# Patient Record
Sex: Male | Born: 1980 | Race: White | Hispanic: No | Marital: Married | Smoking: Never smoker
Health system: Southern US, Community
[De-identification: ages and names within clinical notes are randomized; demographics above are authoritative.]

## PROBLEM LIST (undated history)

## (undated) DIAGNOSIS — K5090 Crohn's disease, unspecified, without complications: Secondary | ICD-10-CM

## (undated) HISTORY — PX: SMALL INTESTINE SURGERY: SHX150

## (undated) HISTORY — PX: TONSILLECTOMY: SUR1361

---

## 2011-08-13 ENCOUNTER — Encounter: Payer: Self-pay | Admitting: *Deleted

## 2011-08-13 DIAGNOSIS — D72829 Elevated white blood cell count, unspecified: Secondary | ICD-10-CM | POA: Diagnosis present

## 2011-08-13 DIAGNOSIS — K5090 Crohn's disease, unspecified, without complications: Principal | ICD-10-CM | POA: Diagnosis present

## 2011-08-13 DIAGNOSIS — Z79899 Other long term (current) drug therapy: Secondary | ICD-10-CM | POA: Insufficient documentation

## 2011-08-13 DIAGNOSIS — Z9049 Acquired absence of other specified parts of digestive tract: Secondary | ICD-10-CM

## 2011-08-13 DIAGNOSIS — K219 Gastro-esophageal reflux disease without esophagitis: Secondary | ICD-10-CM | POA: Insufficient documentation

## 2011-08-13 DIAGNOSIS — T380X5A Adverse effect of glucocorticoids and synthetic analogues, initial encounter: Secondary | ICD-10-CM | POA: Diagnosis present

## 2011-08-13 DIAGNOSIS — F19982 Other psychoactive substance use, unspecified with psychoactive substance-induced sleep disorder: Secondary | ICD-10-CM | POA: Diagnosis present

## 2011-08-13 DIAGNOSIS — D62 Acute posthemorrhagic anemia: Secondary | ICD-10-CM | POA: Diagnosis present

## 2011-08-13 DIAGNOSIS — K625 Hemorrhage of anus and rectum: Secondary | ICD-10-CM | POA: Insufficient documentation

## 2011-08-13 DIAGNOSIS — R000 Tachycardia, unspecified: Secondary | ICD-10-CM | POA: Diagnosis present

## 2011-08-13 DIAGNOSIS — R197 Diarrhea, unspecified: Secondary | ICD-10-CM | POA: Insufficient documentation

## 2011-08-13 DIAGNOSIS — IMO0002 Reserved for concepts with insufficient information to code with codable children: Secondary | ICD-10-CM

## 2011-08-13 NOTE — ED Notes (Signed)
Pt c/o passing dark brown blood from his rectum x 2hrs.

## 2011-08-14 ENCOUNTER — Inpatient Hospital Stay (HOSPITAL_COMMUNITY): Payer: BC Managed Care – PPO

## 2011-08-14 ENCOUNTER — Inpatient Hospital Stay (HOSPITAL_COMMUNITY)
Admission: AD | Admit: 2011-08-14 | Discharge: 2011-08-16 | DRG: 179 | Disposition: A | Discharge status: Home / Self Care | Payer: BC Managed Care – PPO | Source: Other Acute Inpatient Hospital | Attending: Internal Medicine | Admitting: Internal Medicine

## 2011-08-14 ENCOUNTER — Emergency Department (HOSPITAL_COMMUNITY)
Admission: EM | Admit: 2011-08-14 | Discharge: 2011-08-14 | Disposition: A | Discharge status: Other Acute Inpatient Hospital | Payer: BC Managed Care – PPO | Source: Home / Self Care | Attending: Emergency Medicine | Admitting: Emergency Medicine

## 2011-08-14 DIAGNOSIS — K5090 Crohn's disease, unspecified, without complications: Secondary | ICD-10-CM

## 2011-08-14 DIAGNOSIS — K625 Hemorrhage of anus and rectum: Secondary | ICD-10-CM

## 2011-08-14 HISTORY — DX: Crohn's disease, unspecified, without complications: K50.90

## 2011-08-14 LAB — CBC
MCH: 28.4 pg (ref 26.0–34.0)
MCHC: 32.7 g/dL (ref 30.0–36.0)
MCV: 86.8 fL (ref 78.0–100.0)
Platelets: 335 10*3/uL (ref 150–400)

## 2011-08-14 LAB — BASIC METABOLIC PANEL
CO2: 27 mEq/L (ref 19–32)
Calcium: 8.5 mg/dL (ref 8.4–10.5)
Creatinine, Ser: 1.05 mg/dL (ref 0.50–1.35)
GFR calc non Af Amer: >60 mL/min (ref >60)
Glucose, Bld: 122 mg/dL — ABNORMAL HIGH (ref 70–99)
Sodium: 136 mEq/L (ref 135–145)

## 2011-08-14 LAB — HEMOGLOBIN AND HEMATOCRIT, BLOOD: HCT: 37.2 % — ABNORMAL LOW (ref 39.0–52.0)

## 2011-08-14 MED ORDER — SODIUM CHLORIDE 0.9 % IV SOLN
Freq: Once | INTRAVENOUS | Status: AC
  Administered 2011-08-14: 08:00:00 via INTRAVENOUS

## 2011-08-14 MED ORDER — SODIUM CHLORIDE 0.9 % IV BOLUS (SEPSIS)
1000.0000 mL | Freq: Once | INTRAVENOUS | Status: AC
  Administered 2011-08-14: 1000 mL via INTRAVENOUS

## 2011-08-14 NOTE — ED Notes (Signed)
BP 97/60 HR of 97. Additional liter bolus ordered. H&H ordered. Verbal order for 2nd line obtained. NAD. PT a/o. Wife at bedside.

## 2011-08-14 NOTE — ED Notes (Addendum)
States 2 loose BM's since 2000, dark maroon in color per patient, denies blood clots, denies any dizziness or weakness, hx of Crohn's dz and is on Prednisone since Sept. 11

## 2011-08-14 NOTE — ED Notes (Signed)
Patient had gotten up to BR and had large loose stool, maroon in color, no formed stool noted, pt had gotten very pale and diaphoreic while sitting on toilet, pt assisted back to bed with wife, pt remains pale, EDP notified of above, vitals stable

## 2011-08-14 NOTE — ED Notes (Signed)
Pt stated he did not wish to have O2 applied at this time. Explained to pt that if Hgb came back low, it would need to be applied.

## 2011-08-14 NOTE — ED Notes (Signed)
Report given to [facility]. Pt a/o VSS.

## 2011-08-14 NOTE — ED Notes (Signed)
MD at bedside. 

## 2011-08-14 NOTE — ED Provider Notes (Addendum)
History     CSN: [id number] Arrival date & time: 08/14/2011 12:27 AM  Chief Complaint  Patient presents with  . Rectal Bleeding    (Consider location/radiation/quality/duration/timing/severity/associated sxs/prior treatment) HPI Comments: Examined at 0101. Patient states that since being on high dose prednisone he has experienced muscle pain, weakness, sweating, increased appetite, poor sleep pattern, GERD symptoms. He is currently taking an unknown GI agent given to him by his father for GERD which has helped reflux symptoms.  Patient is a 30 y.o. male presenting with hematochezia. The history is provided by the patient and the spouse (Patient with long standing h/o Crohn's disease who has recently had a flair. Currently on  prednisone taper. Rectal bleeding begain tonight.).  Rectal Bleeding  The current episode started today. The problem occurs frequently. The problem has been unchanged. The patient is experiencing no pain. The stool is described as mixed with blood. Treatments tried: Patient has had Crohn's under good control on Cimzia until early September when developed pain and loose stools. Placed on high dose prednisone.  Associated symptoms include diarrhea. Pertinent negatives include no anorexia, no fever, no nausea, no rectal pain, no vomiting, no hematuria, no chest pain, no headaches, no coughing and no difficulty breathing. He has been eating and drinking normally. Urine output has been normal. The last void occurred less than 6 hours ago. His past medical history is significant for abdominal surgery and inflammatory bowel disease. Past medical history comments: resection 2002. Recently, medical care has been given by a specialist (Seen last week by GI doctor in [place], Dr. Neoma Laming). Services Performed: change in medications.    Past Medical History  Diagnosis Date  . Crohn disease     Past Surgical History  Procedure Date  . Small intestine surgery   . Tonsillectomy       History reviewed. No pertinent family history.  History  Substance Use Topics  . Smoking status: Never Smoker   . Smokeless tobacco: Not on file  . Alcohol Use: Yes     occasional      Review of Systems  Constitutional: Negative for fever.  Respiratory: Negative for cough.   Cardiovascular: Negative for chest pain.  Gastrointestinal: Positive for diarrhea and hematochezia. Negative for nausea, vomiting, rectal pain and anorexia.  Genitourinary: Negative for hematuria.  Neurological: Negative for headaches.  All other systems reviewed and are negative.    Allergies  Ceclor  Home Medications   Current Outpatient Rx  Name Route Sig Dispense Refill  . CERTOLIZUMAB PEGOL 2 X 200 MG [place] KIT Subcutaneous Inject into the skin.      Marland Kitchen LORAZEPAM 0.5 MG PO TABS Oral Take 0.5 mg by mouth every 8 (eight) hours.      Marland Kitchen PREDNISONE 20 MG PO TABS Oral Take 20 mg by mouth daily.        BP 117/63  Pulse 146  Temp(Src) 98.2 F (36.8 C) (Oral)  Resp 20  Ht 6\' 2"  (1.88 m)  Wt 182 lb (82.555 kg)  BMI 23.37 kg/m2  SpO2 100%  Physical Exam  Nursing note and vitals reviewed. Constitutional: He is oriented to person, place, and time. He appears well-developed and well-nourished. No distress.  HENT:  Head: Normocephalic.  Nose: Nose normal.  Mouth/Throat: Oropharyngeal exudate present.  Eyes: EOM are normal.  Neck: Normal range of motion.  Cardiovascular: Normal heart sounds and intact distal pulses.        tachycardic  Pulmonary/Chest: Effort normal and breath sounds normal.  Abdominal:  Soft. There is tenderness. There is guarding. There is no rebound.  Genitourinary:       Rectal exam normal. Brown stool, guiac positive  Musculoskeletal: Normal range of motion.  Neurological: He is alert and oriented to person, place, and time.  Skin: Skin is warm and dry.    ED Course  Procedures (including critical care time) Results for orders placed during the hospital encounter of  08/14/11  CBC      Component Value Range   WBC 14.9 (*) 4.0 - 10.5 (K/uL)   RBC 4.55  4.22 - 5.81 (MIL/uL)   Hemoglobin 12.9 (*) 13.0 - 17.0 (g/dL)   HCT 16.1  09.6 - 04.5 (%)   MCV 86.8  78.0 - 100.0 (fL)   MCH 28.4  26.0 - 34.0 (pg)   MCHC 32.7  30.0 - 36.0 (g/dL)   RDW 40.9  81.1 - 91.4 (%)   Platelets 335  150 - 400 (K/uL)  BASIC METABOLIC PANEL      Component Value Range   Sodium 136  135 - 145 (mEq/L)   Potassium 4.2  3.5 - 5.1 (mEq/L)   Chloride 99  96 - 112 (mEq/L)   CO2 27  19 - 32 (mEq/L)   Glucose, Bld 122 (*) 70 - 99 (mg/dL)   BUN 27 (*) 6 - 23 (mg/dL)   Creatinine, Ser 7.82  0.50 - 1.35 (mg/dL)   Calcium 8.5  8.4 - 95.6 (mg/dL)   GFR calc non Af Amer >60  >60 (mL/min)   GFR calc Af Amer >60  >60 (mL/min)   No results found.    MDM  Patient with h/o Crohn's recently with exacerbation of disease placed on high dose steroids. Developed rectal bleeding. Stool guiac positive.He has experienced multiple side effects from the steroids including, myositis, muscle weakness, sleep disturbance, gastritis, diaphoresis, tachycardia. Orthostatics positive by heart rate but without symptoms. Labs with elevated wbc most likely due to steroids. Hgb stable for this patient at 12.9. Administered IVFs, encouraged PO fluids. Heart rate normalized. No further stools since arrival i the ER. He has remained hemodynamically stable. He has access to his gastroenterologist in [place] on Monday. After discussion he will be discharged home, with rapid taper off prednisone. He will be in touch with his gastroenterologist on Monday. Should he develop increased rectal bleeding, he will return to the ER. Pt stable in ED with no significant deterioration in condition.Pt feels improved after observation and/or treatment in ED.Patient informed of clinical course, understand medical decision-making process, and agree with plan. MDM Reviewed: nursing note and vitals Interpretation: labs Total time  providing critical care: 45.  Nicoletta Dress. Colon Branch, MD 08/14/11 0530  2130 Patient was in process of being discharged. He went to the bathroom and passed a large amount of blood with no stool. Discharge was canceled. Patient to be admitted to the hospital.Will speak with both GI and hospitalist.  Nicoletta Dress. Colon Branch, MD 08/14/11 [phone] Spoke with Dr. Caesar Bookman, general surgeon covering for GI. He does not see new patients. Advised patient will need to go to [facility]. [phone] Spoke with Dr. Rito Ehrlich. Advised I would be trying to reach the patient's GI doctor in [place] for transfer. He agreed patient would not be suitable for admission at [facility] due to lack of GI coverage. [phone] Spoke with Dr. Konrad Penta, GI [facility]. He does not have admitting privileges at [facility]. He felt best interest of patient would be transfer to [facility]. [phone] Spoke with Dr. Russella Dar,  GI in [place]. He is willing to consult on the patient. He asked that hospitalist admit. [phone] Have advised patient of process and plan. He is in agreement with transfer to [facility] or [facility]. [phone] Spoke with Dr. Rito Ehrlich, hospitalist. He advised he would have daytime hospitalist contact me for admission H&P for transfer to [facility]. 7 Spoke with Dr. Sherrie Mustache, hospitalist. She asked that we contact hospitalist at [facility] directly for admission. [phone] Contacted [facility] for facilitation of contacting hospitalist. [phone] Advised by nursing that patient has a blood pressure of 97/60. Asked that IVF be given, bolus of one liter and rate of 125cc/hr. Repeat H/H ordered and pending. Shadi.Skates Spoke with Dr. Darnelle Catalan, hospitalist at [facility]. She has accepted the patient in transfer. [facility] to provide transportation.   Nicoletta Dress. Colon Branch, MD 08/14/11 2841  Nicoletta Dress. Colon Branch, MD 08/14/11 [phone] Vital signs after 500 cc bolus BP 127/70, HR 93. Remains pain free. The patient appears reasonably stabilized for admission considering the current resources, flow, and  capabilities available in the ED at this time, and I doubt any other [facility] requiring further screening and/or treatment in the ED prior to admission.  Nicoletta Dress. Colon Branch, MD 08/14/11 3244

## 2011-08-15 LAB — CBC
HCT: 32.6 % — ABNORMAL LOW (ref 39.0–52.0)
MCH: 27.9 pg (ref 26.0–34.0)
MCV: 86.5 fL (ref 78.0–100.0)
Platelets: 271 10*3/uL (ref 150–400)
RBC: 3.77 MIL/uL — ABNORMAL LOW (ref 4.22–5.81)
WBC: 16.8 10*3/uL — ABNORMAL HIGH (ref 4.0–10.5)

## 2011-08-15 LAB — COMPREHENSIVE METABOLIC PANEL
BUN: 20 mg/dL (ref 6–23)
CO2: 28 mEq/L (ref 19–32)
Calcium: 8.2 mg/dL — ABNORMAL LOW (ref 8.4–10.5)
Chloride: 100 mEq/L (ref 96–112)
Creatinine, Ser: 0.79 mg/dL (ref 0.50–1.35)
GFR calc Af Amer: >90 mL/min (ref >90)
GFR calc non Af Amer: >90 mL/min (ref >90)
Glucose, Bld: 118 mg/dL — ABNORMAL HIGH (ref 70–99)
Total Bilirubin: 0.8 mg/dL (ref 0.3–1.2)

## 2011-08-15 LAB — HEMOGLOBIN AND HEMATOCRIT, BLOOD
HCT: 31.3 % — ABNORMAL LOW (ref 39.0–52.0)
Hemoglobin: 10.1 g/dL — ABNORMAL LOW (ref 13.0–17.0)

## 2011-08-16 LAB — BASIC METABOLIC PANEL
BUN: 16 mg/dL (ref 6–23)
Chloride: 101 mEq/L (ref 96–112)
GFR calc Af Amer: >90 mL/min (ref >90)
GFR calc non Af Amer: >90 mL/min (ref >90)
Potassium: 4.2 mEq/L (ref 3.5–5.1)
Sodium: 138 mEq/L (ref 135–145)

## 2011-08-16 LAB — CBC
HCT: 32.7 % — ABNORMAL LOW (ref 39.0–52.0)
MCHC: 32.4 g/dL (ref 30.0–36.0)
Platelets: 320 10*3/uL (ref 150–400)
RDW: 14.3 % (ref 11.5–15.5)
WBC: 25 10*3/uL — ABNORMAL HIGH (ref 4.0–10.5)

## 2011-08-17 NOTE — Discharge Summary (Signed)
Allen Ewing, Allen Ewing       ACCOUNT NO.:  [id number]  MEDICAL RECORD NO.:  [id number]  LOCATION:  1529                         FACILITY:  [facility]  PHYSICIAN:  Hillery Aldo, M.D.   DATE OF BIRTH:  04-10-1981  DATE OF ADMISSION:  08/14/2011 DATE OF DISCHARGE:  08/16/2011                              DISCHARGE SUMMARY   GASTROENTEROLOGIST:  Dr. Aleene Davidson in [place], [place].  DISCHARGE DIAGNOSES: 1. Crohn disease with acute flare. 2. Rectal bleeding secondary to Crohn disease. 3. Acute blood loss anemia. 4. 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 5. Leukocytosis.  DISCHARGE MEDICATIONS: 1. Protonix 40 mg p.o. daily while on prednisone for stomach     protection. 2. Lorazepam 0.5 mg p.o. q.h.s. p.r.n. sleep. 3. Prednisone 40 mg p.o. daily until followup with Dr. Aleene Davidson. 4. Cimzia one injection intramuscularly every 4 weeks per Dr.     Aleene Davidson.  CONSULTATIONS:  Shirley Friar, MD of Gastroenterology.  BRIEF ADMISSION HISTORY OF PRESENT ILLNESS:  The patient is a 30 year old my male who presented to [facility] with bloody stools.  He was seen and evaluated with plans to discharge him home and have close followup with Dr. Aleene Davidson.  However, he had a frankly bloody stool in the emergency department and therefore was transferred to [facility] for GI consultation as no gastroenterologist was on-call were available on the date he was admitted.  For the full details, please see my dictated H and P.  PROCEDURES AND DIAGNOSTIC STUDIES:  Two views of the abdomen on August 14, 2011, showed a nonobstructive bowel gas pattern.  DISCHARGE LABORATORY VALUES:  Sodium was 138, potassium 4.2, chloride 101, bicarb 30, BUN 16, creatinine 0.82, glucose 132, calcium 8.6. White blood cell count was 25, hemoglobin 10.6, hematocrit 32.7, platelets 320.  HOSPITAL COURSE BY PROBLEM: 1. Crohn's flare:  The patient was admitted and put on IV Solu-Medrol.     He  was initially put on a clear liquid diet and his diet advanced     to full liquids over the course of his hospital stay.  The patient     had no complaints of abdominal pain, nausea, or vomiting while in     the hospital.  His bloody stools, which were attributable to the     Crohn's flare, completely resolved.  Radiographs did not reveal any     evidence of bowel obstruction given his known history of stricture.     The patient was seen and followed by the gastroenterologist while     in the hospital and they cleared him for discharge.  They do     recommend prednisone therapy at 40 mg daily until he could follow     up with his primary gastroenterologist. 2. Rectal bleed with acute blood loss anemia:  The patient's admission     hemoglobin was 12.9.  This was checked serially and reached a low     value of 10.5.  Over the past 24 hours, this has been stable with     his discharge hemoglobin at 10.6.  The patient has no further     complaints of hematochezia and in fact, had a brown stool that was     formed on  the day of discharge.  At this point, we will put him on     PPI therapy for GI protection given his chronic prednisone therapy     and have him follow up with his primary gastroenterologist     postdischarge. 3. Leukocytosis:  This appears to be secondary to demargination from     steroid use.  He should have a close follow up with his primary     care physician. 4. Insomnia:  The patient will be sent home with a prescription for     Ativan to use on an as-needed basis.  His insomnia appears to be     triggered by steroid use.  DISPOSITION:  The patient is medically stable and will be discharged home.  CONDITION AT DISCHARGE:  Improved.  DISCHARGE INSTRUCTIONS:  Activity:  No restrictions.  Diet:  No restrictions, as tolerated.  FOLLOWUP:  Dr. Aleene Davidson within 1 week.  Call for an appointment time.  Time spent coordinating care for discharge, discharge  instructions including face-to-face time equals approximately 25 minutes.     Hillery Aldo, M.D.     CR/MEDQ  D:  08/16/2011  T:  08/16/2011  Job:  [id number]  cc:   Dr. Lia Foyer, [place]  Electronically Signed by Hillery Aldo M.D. on 08/17/2011 02:01:25 PM

## 2011-08-17 NOTE — H&P (Signed)
Allen Ewing, Allen Ewing       ACCOUNT NO.:  [id number]  MEDICAL RECORD NO.:  [id number]  LOCATION:  1529                         FACILITY:  [facility]  PHYSICIAN:  Hillery Aldo, M.D.   DATE OF BIRTH:  [birth year]  DATE OF ADMISSION:  08/14/2011 DATE OF DISCHARGE:                             HISTORY & PHYSICAL   PRIMARY CARE PHYSICIAN/GASTROENTEROLOGIST:  Dr. Aleene Davidson in [place], [place]  CHIEF COMPLAINT:  Bloody stool.  HISTORY OF PRESENT ILLNESS:  The patient is a 30 year old male with a past medical history of Crohn's disease diagnosed in 2002.  He is normally maintained on biologic therapy by Dr. Aleene Davidson, which controlled his Crohn's disease fairly well.  The patient states that on July 24, 2011, he developed a flare of his Crohn's disease and saw Dr. Aleene Davidson and was put on prednisone at a dose of 40 mg p.o. b.i.d. on July 26, 2011.  His symptoms subsequently improved and he has been on a prednisone taper since that time.  His current dose of prednisone is 20 mg in the morning and 10 at night.  Last night, the patient developed maroon-colored stools prompting him to go to the [facility] Emergency Department for further evaluation.  He had 2 stools that were maroon in color prior to presentation in the emergency department, but was considered stable and the plan was to discharge him home with followup with Dr. Aleene Davidson, however while in the emergency department, he had a frankly bloody stool and was felt to need GI evaluation.  A gastroenterologist not currently available for consultation at [facility] and therefore, the patient was transferred to [facility] for further evaluation of his GI bleeding.  He denies any associated abdominal pain, fever, nausea, or vomiting.  PAST MEDICAL HISTORY: 1. Crohn's disease diagnosed in 2002 by partial colectomy.  The     patient states he had 12 inches of small bowel removed. 2. Status post tonsillectomy. 3.  Status post incidental appendectomy (taken out during his partial     colectomy).  SOCIAL HISTORY:  The patient is single and lives with his girlfriend. He is a lifelong nonsmoker.  He occasionally drinks beer, but not too excess.  No history of drug use.  He is employed as an Art gallery manager.  FAMILY HISTORY:  The patient's mother is alive at 65 and has hypertension.  The patient's father is alive at 62 and is healthy.  He has a healthy sister.  CURRENT MEDICATIONS: 1. Cimzia injections every 4 weeks. 2. Lorazepam 0.5 mg p.o. b.i.d. 3. Prednisone 20 mg p.o. q.a.m., 10 mg p.o. q.p.m.  ALLERGIES:  CECLOR causes hives.  REVIEW OF SYSTEMS:  CONSTITUTIONAL:  The patient states he has had some weight gain over time and that his appetite is good.  He has had some diaphoresis.  No fever or chills.  HEENT:  No complaints. CARDIOVASCULAR:  Chest pain or dysrhythmia.  RESPIRATORY:  Shortness of breath or cough.  GI:  Positive for loose stools, maroon in color.  No nausea, vomiting, or abdominal pain.  GU:  No dysuria.  MUSCULOSKELETAL:Positive for calf cramps.  A comprehensive 14-point review of systems otherwise unremarkable.  PHYSICAL EXAM:  VITAL SIGNS:  Temperature 98.6, blood pressure 112/63, pulse 93,  respirations 16. GENERAL:  Well-developed, well-nourished male, in no acute distress. HEENT:  Normocephalic, atraumatic.  PERRL.  EOMI.  Oropharynx is clear. NECK:  Supple, no thyromegaly, no lymphadenopathy, no jugular venous distention. CHEST:  Lungs clear to auscultation bilaterally with good air movement. HEART:  Regular rate, rhythm.  No murmurs, rubs, or gallops. ABDOMEN:  Soft, nontender, nondistended with normoactive bowel sounds. EXTREMITIES:  No clubbing, edema, or cyanosis. NEUROLOGIC:  Patient is alert and oriented x3.  Nonfocal.  DATA REVIEW:  White blood cell count is 14.9, hemoglobin 12.9, hematocrit 39.5, platelets 335.  Sodium is 136, potassium 4.2, chloride 99, bicarb 27,  BUN 27, creatinine 1.05, glucose 122, calcium 8.5. Repeat H and H is 11.8.  ASSESSMENT AND PLAN: 1. Gastrointestinal bleed:  Unclear if this is upper or lower.     Concerns for upper GI bleed include the fact that he has been on     high-dose prednisone for several weeks without GI protection.  At     this point, we would put him on IV proton pump inhibitor therapy     and IV fluids, keep him n.p.o., and serial H and H checks.  We will     monitor his hemodynamic status closely.  GI consultation has been     requested and the case discussed with Dr. Bosie Clos who will see him     in consultation. 2. Crohn's disease:  The patient will be started on IV Solu-Medrol and     further recommendations per GI consultation. 3. Acute blood loss anemia:  The patient did have a 1 gram drop in his     hemoglobin over his admission hemoglobin level.  Again, we will     work this up further with the help of the gastroenterology     consultant and monitor him closely.  We will transfuse if needed. 4. Prophylaxis:  The patient is ambulatory and with his GI bleeding,     Lovenox is contraindicated, so I do not think any specific DVTprophylaxis is needed at this time.  We will place him on PPI     therapy for GI protection.  Time spent on admission including face-to-face time is approximately 1 hour.     Hillery Aldo, M.D.     CR/MEDQ  D:  08/14/2011  T:  08/14/2011  Job:  [id number]  cc:   Dr. Lia Foyer, [place]  Electronically Signed by Hillery Aldo M.D. on 08/17/2011 02:00:16 PM

## 2011-08-29 NOTE — Consult Note (Signed)
Allen Ewing, Allen Ewing       ACCOUNT NO.:  [id number]  MEDICAL RECORD NO.:  [id number]  LOCATION:  1529                         FACILITY:  [facility]  PHYSICIAN:  Shirley Friar, MDDATE OF BIRTH:  03/04/1981  DATE OF CONSULTATION: DATE OF DISCHARGE:                                CONSULTATION   REQUESTING PHYSICIAN:  Hillery Aldo, MD  REASON FOR CONSULTATION:  Crohn's disease, rectal bleeding.  HISTORY OF PRESENT ILLNESS:  Mr. Allen Ewing is a pleasant 30 year old white male with a history of Crohn's disease diagnosed in 2002 who had a what sounds like a ileocecectomy at that time.  He reports being on postoperatively for about 6 years and then being placed on Remicade.  He was on Remicade for about 3 years up to January 2012 when he had a Crohn's flare and was changed to Cimzia.  His primary GI doctor is in [place], Dr. Aleene Davidson.  He reports having a CT scan and colonoscopy in February of this year in [place] that showed a small-bowel stricture, but those records are not available at this time.  He presented to [facility] yesterday due to a large amount of rectal bleeding with loose stools.  He was hemodynamically stable and was going to be discharged home and then he had another large bloody bowel movement prior to discharge.  His hemoglobin was 12.9 yesterday and his white blood count was 14.9.  He was transferred from [facility] to [place] due to lack of GI coverage.  Notes from [facility] also state that they talked to Dr. Aleene Davidson in [place] who also felt the patient needed to be sent to [facility].  He states that he had a flare of his Crohn's disease around July 24, 2011, and his Richardo Ewing was stopped and he was placed on p.o. prednisone.  He states he started having solid stools on prednisone but would have lower abdominal cramping.  His stools last night were loose with the bloody episode.  He denies any nausea, vomiting, fevers or  chills.  PAST MEDICAL HISTORY: 1. Crohn's disease involving small intestine and colon based on his     history, status post ileocecectomy in 2002, although the operative     note is not available at this time.  He reports 12 inches of small-     bowel were removed. 2. Status post appendectomy (removed during colon surgery in 2002). 3. Status post tonsillectomy.  CURRENT MEDICATIONS: 1. Cimzia, last injection almost 2 weeks ago. 2. Prednisone 20 mg p.o. q.a.m., 10 mg p.o. at bedtime. 3. Lorazepam 0.5 mg p.o. b.i.d.  ALLERGIES:  CECLOR causes hives.  FAMILY HISTORY:  Noncontributory.  SOCIAL HISTORY:  Denies smoking, occasional alcohol.  Denies drugs, single.  REVIEW OF SYSTEMS:  Negative from GI standpoint except as stated above.  PHYSICAL EXAM:  VITAL SIGNS:  Temperature is 97.9, pulse 98, blood pressure 138/78, O2 saturation 99% on room air. GENERAL:  Alert, well-nourished, no acute distress. ABDOMEN:  Minimal tenderness in suprapubic and right lower quadrant without guarding, otherwise nontender, soft, nondistended. Positive bowel sounds.  LABS:  White blood count 14.9, hemoglobin 11.8, platelet count 335.  BUN 27, creatinine 1.05, potassium 4.2.  Other labs noted in the hospital  record and reviewed.  IMPRESSION AND PLAN:  30 year old white male with Crohn's disease who came in with rectal bleeding likely due to flare up of his Crohn's.  The history of a possible stricture could be playing a  role as well.  He does not have any obstructive symptoms.  We will do an abdominal x-ray. Agree with IV steroids.  Will start clear liquid diet.  If his symptoms do not improve on IV steroids, then may need to do a CAT scan or repeat colonoscopy.  It will be helpful to have his records from [facility] sent down and specifically his last colonoscopy and CT scan.  He may also need a CT scan if his symptoms have not improved.  If diarrhea occurs, then may need to do a Clostridium  difficile check as well.     Shirley Friar, MD     VCS/MEDQ  D:  08/14/2011  T:  08/14/2011  Job:  [id number]  cc:   Holley Dexter, MD [address] [place], [place] [address]  Electronically Signed by Charlott Rakes MD on 08/29/2011 07:54:42 PM

## 2016-05-19 ENCOUNTER — Observation Stay (HOSPITAL_COMMUNITY)
Admission: EM | Admit: 2016-05-19 | Discharge: 2016-05-20 | Disposition: A | Discharge status: Home / Self Care | Payer: BLUE CROSS/BLUE SHIELD | Attending: Internal Medicine | Admitting: Internal Medicine

## 2016-05-19 ENCOUNTER — Encounter (HOSPITAL_COMMUNITY): Payer: Self-pay | Admitting: Emergency Medicine

## 2016-05-19 DIAGNOSIS — D62 Acute posthemorrhagic anemia: Secondary | ICD-10-CM | POA: Diagnosis present

## 2016-05-19 DIAGNOSIS — D509 Iron deficiency anemia, unspecified: Secondary | ICD-10-CM | POA: Diagnosis present

## 2016-05-19 DIAGNOSIS — K921 Melena: Secondary | ICD-10-CM | POA: Diagnosis present

## 2016-05-19 DIAGNOSIS — K5090 Crohn's disease, unspecified, without complications: Secondary | ICD-10-CM | POA: Diagnosis present

## 2016-05-19 DIAGNOSIS — M5136 Other intervertebral disc degeneration, lumbar region: Secondary | ICD-10-CM | POA: Insufficient documentation

## 2016-05-19 DIAGNOSIS — K922 Gastrointestinal hemorrhage, unspecified: Secondary | ICD-10-CM | POA: Insufficient documentation

## 2016-05-19 DIAGNOSIS — K50011 Crohn's disease of small intestine with rectal bleeding: Principal | ICD-10-CM | POA: Insufficient documentation

## 2016-05-19 DIAGNOSIS — Z888 Allergy status to other drugs, medicaments and biological substances status: Secondary | ICD-10-CM | POA: Insufficient documentation

## 2016-05-19 LAB — CBC WITH DIFFERENTIAL/PLATELET
BASOS ABS: 0 10*3/uL (ref 0.0–0.1)
BASOS PCT: 0 %
Eosinophils Absolute: 0.3 10*3/uL (ref 0.0–0.7)
Eosinophils Relative: 4 %
HEMATOCRIT: 29.4 % — AB (ref 39.0–52.0)
HEMOGLOBIN: 9.1 g/dL — AB (ref 13.0–17.0)
Lymphocytes Relative: 33 %
Lymphs Abs: 2.6 10*3/uL (ref 0.7–4.0)
MCH: 25 pg — ABNORMAL LOW (ref 26.0–34.0)
MCHC: 31 g/dL (ref 30.0–36.0)
MCV: 80.8 fL (ref 78.0–100.0)
Monocytes Absolute: 0.4 10*3/uL (ref 0.1–1.0)
Monocytes Relative: 5 %
NEUTROS ABS: 4.5 10*3/uL (ref 1.7–7.7)
NEUTROS PCT: 58 %
Platelets: 308 10*3/uL (ref 150–400)
RBC: 3.64 MIL/uL — ABNORMAL LOW (ref 4.22–5.81)
RDW: 15.5 % (ref 11.5–15.5)
WBC: 7.9 10*3/uL (ref 4.0–10.5)

## 2016-05-19 LAB — SAMPLE TO BLOOD BANK

## 2016-05-19 NOTE — ED Notes (Signed)
Pt. reports bloody stools this morning with mild generalized abdominal discomfort , no emesis or diarrhea , he has a history of Crohn's disease and received Iron infusion this morning ,.

## 2016-05-19 NOTE — ED Provider Notes (Signed)
CSN: [id number]     Arrival date & time 05/19/16  2311 History   First MD Initiated Contact with Patient 05/19/16 2336     Chief Complaint  Patient presents with  . Hematochezia     (Consider location/radiation/quality/duration/timing/severity/associated sxs/prior Treatment) HPI   Allen Ewing is a 35 y.o. male, with a history of Crohns disease, presenting to the ED with a GI bleed. Had some bright red blood in his stool this morning. Then this evening had loose stool with large clumps of dark red and almost black stool. History of Crohns with two bowel resections. Humera injections biweekly. Received ferritin transfusion last Thursday and this afternoon due to a ferritin of 4. Patient's last hemoglobin was around 12 about two weeks ago. Dr. Vista Lawman is patient's GI specialist at [facility]. Last abdominal MRI was July 2016 and was normal. Last food was 1 pm on July 6. Last colonoscopy was in 2013 and was normal. Has never had to have a blood transfusion. Denies abdominal pain, fever/chills, N/V, dizziness, shortness of breath, chest pain, or any other complaints.   Past Medical History  Diagnosis Date  . Crohn disease [facility])    Past Surgical History  Procedure Laterality Date  . Small intestine surgery    . Tonsillectomy     No family history on file. Social History  Substance Use Topics  . Smoking status: Never Smoker   . Smokeless tobacco: None  . Alcohol Use: Yes     Comment: occasional    Review of Systems  Constitutional: Negative for fever and chills.  Respiratory: Negative for shortness of breath.   Cardiovascular: Negative for chest pain.  Gastrointestinal: Positive for blood in stool and anal bleeding. Negative for nausea, vomiting, abdominal pain and abdominal distention.  All other systems reviewed and are negative.     Allergies  Ceclor  Home Medications   Prior to Admission medications   Medication Sig Start Date End Date Taking? Authorizing Provider   Certolizumab Pegol (CIMZIA) 2 X 200 MG KIT Inject into the skin.      Historical Provider, MD  LORazepam (ATIVAN) 0.5 MG tablet Take 0.5 mg by mouth every 8 (eight) hours.      Historical Provider, MD  predniSONE (DELTASONE) 20 MG tablet Take 20 mg by mouth daily.      Historical Provider, MD   BP 119/72 mmHg  Pulse 86  Temp(Src) 97.8 F (36.6 C) (Oral)  Resp 18  Ht '6\' 2"'$  (1.88 m)  Wt 79.153 kg  BMI 22.40 kg/m2  SpO2 100% Physical Exam  Constitutional: He appears well-developed and well-nourished. No distress.  HENT:  Head: Normocephalic and atraumatic.  Eyes: Conjunctivae are normal.  Neck: Neck supple.  Cardiovascular: Normal rate, regular rhythm, normal heart sounds and intact distal pulses.   Pulmonary/Chest: Effort normal and breath sounds normal. No respiratory distress.  Abdominal: Soft. There is no tenderness. There is no guarding.  Genitourinary: Guaiac positive stool.  No external hemorrhoids, fissures, or lesions noted. No gross blood or stool burden. No rectal tenderness. Prostate is not enlarged or boggy and is nontender. RN served as Producer, television/film/video during the rectal exam.  Musculoskeletal: He exhibits no edema or tenderness.  Lymphadenopathy:    He has no cervical adenopathy.  Neurological: He is alert.  Skin: Skin is warm and dry. He is not diaphoretic.  Psychiatric: He has a normal mood and affect. His behavior is normal.  Nursing note and vitals reviewed.   ED Course  Procedures (including critical  care time) Labs Review Labs Reviewed  CBC WITH DIFFERENTIAL/PLATELET - Abnormal; Notable for the following:    RBC 3.64 (*)    Hemoglobin 9.1 (*)    HCT 29.4 (*)    MCH 25.0 (*)    All other components within normal limits  COMPREHENSIVE METABOLIC PANEL - Abnormal; Notable for the following:    Calcium 8.8 (*)    Total Protein 6.1 (*)    All other components within normal limits  HEMOGLOBIN AND HEMATOCRIT, BLOOD - Abnormal; Notable for the following:     Hemoglobin 8.5 (*)    HCT 27.5 (*)    All other components within normal limits  POC OCCULT BLOOD, ED - Abnormal; Notable for the following:    Fecal Occult Bld POSITIVE (*)    All other components within normal limits  SAMPLE TO BLOOD BANK  TYPE AND SCREEN  ABO/RH   HEMOGLOBIN  Date Value Ref Range Status  05/20/2016 8.5* 13.0 - 17.0 g/dL Final  05/19/2016 9.1* 13.0 - 17.0 g/dL Final  08/16/2011 10.6* 13.0 - 17.0 g/dL Final  08/15/2011 10.1* 13.0 - 17.0 g/dL Final     Imaging Review Ct Abdomen Pelvis W Contrast  05/20/2016  CLINICAL DATA:  Hematochezia. Patient with history of Crohn's disease. EXAM: CT ABDOMEN AND PELVIS WITH CONTRAST TECHNIQUE: Multidetector CT imaging of the abdomen and pelvis was performed using the standard protocol following bolus administration of intravenous contrast. CONTRAST:  129m ISOVUE-300 IOPAMIDOL (ISOVUE-300) INJECTION 61% COMPARISON:  None. FINDINGS: Lower chest:  The included lung bases are clear. No pleural fluid. Liver: Focal fatty infiltration adjacent with falciform ligament. No suspicious lesion. Hepatobiliary: Gallbladder physiologically distended, no calcified stone. No biliary dilatation. Pancreas: No ductal dilatation or inflammation. Spleen: Normal.  Splenule inferiorly. Adrenal glands: No nodule. Kidneys: Symmetric renal enhancement. No hydronephrosis. There multiple vascular collaterals in the region of the left renal hilum. The left renal vein is patent. Stomach/Bowel: Stomach physiologically distended. No gastric wall thickening or inflammation. There are no dilated or thickened small bowel loops. No small bowel inflammation. Post presumed ileus a CT STEMI with enteric suture in the right lower quadrant. No associated small or large bowel wall thickening at the enteric anastomosis. Contrast and stool throughout the colon. Short segment of decompressed colon in the mid sigmoid, difficult to exclude wall thickening at this level. There is otherwise  no colonic inflammation. Minimal perirectal soft tissue edema is seen. Vascular/Lymphatic: Prominent central mesenteric lymph nodes likely reactive. No retroperitoneal adenopathy. Abdominal aorta is normal in caliber. Reproductive: Normal sized prostate gland. Question of scrotal varicoceles, left greater than right. Bladder: Physiologically distended, no wall thickening. Other: No free air, free fluid, or intra-abdominal fluid collection. Musculoskeletal: There are no acute or suspicious osseous abnormalities. Degenerative disc disease at L4-L5. IMPRESSION: 1. Mild perirectal soft tissue stranding, in this setting hematochezia may be acute proctitis. 2. Short-segment decompressed sigmoid colon, difficult to exclude wall thickening at this level. There is otherwise no evidence of bowel inflammation in the abdomen or pelvis or findings to suggest additional sites of active Crohn's disease. 3. Venous collaterals about the left renal hilum, significance is uncertain. There are questionable left-sided scrotal varicoceles, which may contribute to these collaterals. The left renal vein is patent. Electronically Signed   By: MJeb LeveringM.D.   On: 05/20/2016 04:00   I have personally reviewed and evaluated these images and lab results as part of my medical decision-making.   EKG Interpretation None      MDM  Final diagnoses:  Lower GI bleed  Hematochezia    Allen Ewing presents with hematochezia for the last 12 hours.  Findings and plan of care discussed with Merryl Hacker, MD. Dr. Dina Rich personally evaluated and examined this patient.  Patient's anemia is noted, but patient is asymptomatic to it at this time. Patient is nontoxic appearing, afebrile, not tachycardic, not tachypneic, not hypotensive, maintains SPO2 of 100% on room air, and is in no apparent distress. Patient has no signs of sepsis or other serious or life-threatening condition. Pt states today's presentation is  similar to his presentation in 2012 when he was admitted for 3 days. Pt had a large volume bloody BM while here in the ED. IV protonix considered, but unavailable due to backorder. Oral PPI ordered instead. Patient updated on CT and lab findings. Patient continues to be hemodynamically stable. Admission was discussed. Patient states he is comfortable with admission here at [facility] in consultation with the in-house GI physicians. 4:31 AM Spoke with Dr. Paulita Fujita, Gastroenterology, who advised to admit the patient via the hospitalist service and GI will consult later this morning. States there is no need for steroids, antibiotics, or other interventions at this time.  4:54 AM Spoke with Dr. Tamala Julian, hospitalist, who agreed to admit the patient to Med-Surg.   Filed Vitals:   05/20/16 0322 05/20/16 0323 05/20/16 0330 05/20/16 0418  BP: 122/72  116/67 116/71  Pulse:  87 85 84  Temp:      TempSrc:      Resp:    18  Height:      Weight:      SpO2:  100% 100% 100%      Lorayne Bender, PA-C 05/20/16 0503  Merryl Hacker, MD 05/21/16 [phone]

## 2016-05-20 ENCOUNTER — Encounter (HOSPITAL_COMMUNITY)
Admission: EM | Disposition: A | Discharge status: Home / Self Care | Payer: Self-pay | Source: Home / Self Care | Attending: Emergency Medicine

## 2016-05-20 ENCOUNTER — Emergency Department (HOSPITAL_COMMUNITY): Payer: BLUE CROSS/BLUE SHIELD

## 2016-05-20 ENCOUNTER — Encounter (HOSPITAL_COMMUNITY): Payer: Self-pay

## 2016-05-20 DIAGNOSIS — K50919 Crohn's disease, unspecified, with unspecified complications: Secondary | ICD-10-CM | POA: Diagnosis not present

## 2016-05-20 DIAGNOSIS — K921 Melena: Secondary | ICD-10-CM | POA: Diagnosis not present

## 2016-05-20 DIAGNOSIS — K5090 Crohn's disease, unspecified, without complications: Secondary | ICD-10-CM | POA: Diagnosis present

## 2016-05-20 DIAGNOSIS — D509 Iron deficiency anemia, unspecified: Secondary | ICD-10-CM | POA: Diagnosis not present

## 2016-05-20 DIAGNOSIS — D62 Acute posthemorrhagic anemia: Secondary | ICD-10-CM

## 2016-05-20 DIAGNOSIS — K922 Gastrointestinal hemorrhage, unspecified: Secondary | ICD-10-CM

## 2016-05-20 HISTORY — PX: FLEXIBLE SIGMOIDOSCOPY: SHX5431

## 2016-05-20 LAB — COMPREHENSIVE METABOLIC PANEL
ALBUMIN: 3.6 g/dL (ref 3.5–5.0)
ALK PHOS: 50 U/L (ref 38–126)
ALT: 27 U/L (ref 17–63)
ANION GAP: 6 (ref 5–15)
AST: 28 U/L (ref 15–41)
BILIRUBIN TOTAL: 0.4 mg/dL (ref 0.3–1.2)
BUN: 19 mg/dL (ref 6–20)
CALCIUM: 8.8 mg/dL — AB (ref 8.9–10.3)
CO2: 26 mmol/L (ref 22–32)
Chloride: 106 mmol/L (ref 101–111)
Creatinine, Ser: 1.06 mg/dL (ref 0.61–1.24)
GFR calc Af Amer: >60 mL/min (ref >60)
GLUCOSE: 94 mg/dL (ref 65–99)
Potassium: 3.7 mmol/L (ref 3.5–5.1)
Sodium: 138 mmol/L (ref 135–145)
TOTAL PROTEIN: 6.1 g/dL — AB (ref 6.5–8.1)

## 2016-05-20 LAB — HEMOGLOBIN AND HEMATOCRIT, BLOOD
HCT: 27.5 % — ABNORMAL LOW (ref 39.0–52.0)
HEMOGLOBIN: 8.5 g/dL — AB (ref 13.0–17.0)

## 2016-05-20 LAB — CBC
HEMATOCRIT: 27.7 % — AB (ref 39.0–52.0)
HEMOGLOBIN: 8.5 g/dL — AB (ref 13.0–17.0)
MCH: 24.6 pg — ABNORMAL LOW (ref 26.0–34.0)
MCHC: 30.7 g/dL (ref 30.0–36.0)
MCV: 80.1 fL (ref 78.0–100.0)
Platelets: 279 10*3/uL (ref 150–400)
RBC: 3.46 MIL/uL — ABNORMAL LOW (ref 4.22–5.81)
RDW: 16.1 % — AB (ref 11.5–15.5)
WBC: 7.5 10*3/uL (ref 4.0–10.5)

## 2016-05-20 LAB — BASIC METABOLIC PANEL
ANION GAP: 6 (ref 5–15)
BUN: 13 mg/dL (ref 6–20)
CALCIUM: 8.7 mg/dL — AB (ref 8.9–10.3)
CO2: 28 mmol/L (ref 22–32)
Chloride: 107 mmol/L (ref 101–111)
Creatinine, Ser: 1.09 mg/dL (ref 0.61–1.24)
GFR calc Af Amer: >60 mL/min (ref >60)
Glucose, Bld: 92 mg/dL (ref 65–99)
POTASSIUM: 3.7 mmol/L (ref 3.5–5.1)
SODIUM: 141 mmol/L (ref 135–145)

## 2016-05-20 LAB — POC OCCULT BLOOD, ED: FECAL OCCULT BLD: POSITIVE — AB

## 2016-05-20 LAB — TYPE AND SCREEN
ABO/RH(D): O POS
Antibody Screen: NEGATIVE

## 2016-05-20 LAB — ABO/RH: ABO/RH(D): O POS

## 2016-05-20 SURGERY — SIGMOIDOSCOPY, FLEXIBLE
Anesthesia: Moderate Sedation

## 2016-05-20 MED ORDER — MIDAZOLAM HCL 5 MG/5ML IJ SOLN
INTRAMUSCULAR | Status: DC | PRN
  Administered 2016-05-20 (×4): 2 mg via INTRAVENOUS

## 2016-05-20 MED ORDER — FAMOTIDINE IN NACL 20-0.9 MG/50ML-% IV SOLN
20.0000 mg | Freq: Once | INTRAVENOUS | Status: AC
  Administered 2016-05-20: 20 mg via INTRAVENOUS
  Filled 2016-05-20: qty 50

## 2016-05-20 MED ORDER — MIDAZOLAM HCL 5 MG/ML IJ SOLN
INTRAMUSCULAR | Status: AC
  Filled 2016-05-20: qty 2

## 2016-05-20 MED ORDER — SODIUM CHLORIDE 0.9 % IV SOLN
INTRAVENOUS | Status: DC
  Administered 2016-05-20: 07:00:00 via INTRAVENOUS

## 2016-05-20 MED ORDER — FENTANYL CITRATE (PF) 100 MCG/2ML IJ SOLN
INTRAMUSCULAR | Status: DC | PRN
  Administered 2016-05-20: 12.5 ug via INTRAVENOUS
  Administered 2016-05-20: 25 ug via INTRAVENOUS
  Administered 2016-05-20: 12.5 ug via INTRAVENOUS
  Administered 2016-05-20 (×2): 25 ug via INTRAVENOUS

## 2016-05-20 MED ORDER — IOPAMIDOL (ISOVUE-300) INJECTION 61%
INTRAVENOUS | Status: AC
  Administered 2016-05-20: 100 mL
  Filled 2016-05-20: qty 100

## 2016-05-20 MED ORDER — FENTANYL CITRATE (PF) 100 MCG/2ML IJ SOLN
INTRAMUSCULAR | Status: AC
  Filled 2016-05-20: qty 2

## 2016-05-20 MED ORDER — ALBUTEROL SULFATE (2.5 MG/3ML) 0.083% IN NEBU: 2.5000 mg | INHALATION_SOLUTION | RESPIRATORY_TRACT | Status: DC | PRN

## 2016-05-20 MED ORDER — PANTOPRAZOLE SODIUM 40 MG PO TBEC
40.0000 mg | DELAYED_RELEASE_TABLET | Freq: Every day | ORAL | Status: DC
Start: 2016-05-20 — End: 2016-05-20
  Administered 2016-05-20 (×2): 40 mg via ORAL
  Filled 2016-05-20 (×2): qty 1

## 2016-05-20 MED ORDER — SODIUM CHLORIDE 0.9 % IV SOLN
Freq: Once | INTRAVENOUS | Status: AC
  Administered 2016-05-20: 05:00:00 via INTRAVENOUS

## 2016-05-20 MED ORDER — DIATRIZOATE MEGLUMINE & SODIUM 66-10 % PO SOLN
ORAL | Status: AC
  Filled 2016-05-20: qty 30

## 2016-05-20 MED ORDER — DIPHENHYDRAMINE HCL 50 MG/ML IJ SOLN
INTRAMUSCULAR | Status: DC | PRN
Start: 2016-05-20 — End: 2016-05-20
  Administered 2016-05-20: 12.5 mg via INTRAVENOUS

## 2016-05-20 MED ORDER — DIPHENHYDRAMINE HCL 50 MG/ML IJ SOLN
INTRAMUSCULAR | Status: AC
  Filled 2016-05-20: qty 1

## 2016-05-20 MED ORDER — SODIUM CHLORIDE 0.9 % IV BOLUS (SEPSIS)
1000.0000 mL | Freq: Once | INTRAVENOUS | Status: AC
  Administered 2016-05-20: 1000 mL via INTRAVENOUS

## 2016-05-20 MED ORDER — ONDANSETRON HCL 4 MG/2ML IJ SOLN: 4.0000 mg | Freq: Three times a day (TID) | INTRAMUSCULAR | Status: DC | PRN

## 2016-05-20 NOTE — H&P (Signed)
History and Physical    Edgard Debord GQQ:[id number] DOB: 03/19/1981 DOA: 05/19/2016  Referring MD/NP/PA: Debbra Riding PCP: Drue Stager, MD  Patient coming from: home  Chief Complaint: Rectal bleeding  HPI: Allen Ewing is a 35 y.o. male with medical history significant of Chron's disease and iron deficiency anemia; who presents with complaints of rectal bleeding. Symptoms started yesterday morning when he woke up around 8 AM. He notes having some abdominal pressure and gas/bloating feeling prior to using the restroom. Upon looking in the toilet bowel after having a bowel movement he noted bright red blood. Subsequently, patient about his day is normal and notes one the grass without any difficulty. Denies having any shortness of breath, nausea, vomiting, chest pain, or lightheadedness. Then around 8 PM last night he noted having another bloody bowel movement with large clots of dark red almost black stool. Approximately 20 minutes later patient notes having to have another dark bloody bowel movement. He reports that he has not had any significant flares due to his Crohn's disease and is currently on Humira. He is followed by Dr. Emelda Fear of gastroenterology at [facility]. Back in 2013 he had some scar tissue resected out of his small bowel. He had a follow-up MRI last year that showed no recurrence of scar tissue formation. Otherwise patient notes that he chronically has iron deficiency anemia for which he has received 2 iron transfusions over the last 2 weeks. Patient has had similar symptoms with bleeding like this happen back in 2012. He was diagnosed with possibly having a diverticular bleed at that time.  ED Course: Upon admission patient was evaluated and seen to be afebrile with vital signs otherwise within normal limits. Lab work revealed a hemoglobin initially of 9.1 and subsequent recheck noted to be 8.5. Dr. Paulita Fujita gastroenterology was consulted and stated that they would see the  patient in the a.m. [facility] called to admit.  Review of Systems: As per HPI otherwise 10 point review of systems negative.   Past Medical History  Diagnosis Date  . Crohn disease [facility])     Past Surgical History  Procedure Laterality Date  . Small intestine surgery    . Tonsillectomy       reports that he has never smoked. He does not have any smokeless tobacco history on file. He reports that he drinks alcohol. He reports that he does not use illicit drugs.  Allergies  Allergen Reactions  . Ceclor [Cefaclor]     No family history on file.  Prior to Admission medications   Medication Sig Start Date End Date Taking? Authorizing Provider  Certolizumab Pegol (CIMZIA) 2 X 200 MG KIT Inject into the skin.      Historical Provider, MD  LORazepam (ATIVAN) 0.5 MG tablet Take 0.5 mg by mouth every 8 (eight) hours.      Historical Provider, MD  predniSONE (DELTASONE) 20 MG tablet Take 20 mg by mouth daily.      Historical Provider, MD    Physical Exam:    Constitutional: NAD, calm, comfortable Filed Vitals:   05/20/16 0322 05/20/16 0323 05/20/16 0330 05/20/16 0418  BP: 122/72  116/67 116/71  Pulse:  87 85 84  Temp:      TempSrc:      Resp:    18  Height:      Weight:      SpO2:  100% 100% 100%   Eyes: PERRL, lids and conjunctivae normal ENMT: Mucous membranes are moist. Posterior pharynx clear of any exudate or  lesions.Normal dentition.  Neck: normal, supple, no masses, no thyromegaly Respiratory: clear to auscultation bilaterally, no wheezing, no crackles. Normal respiratory effort. No accessory muscle use.  Cardiovascular: Regular rate and rhythm, no murmurs / rubs / gallops. No extremity edema. 2+ pedal pulses. No carotid bruits.  Abdomen: no tenderness, no masses palpated. No hepatosplenomegaly. Bowel sounds positive.  Musculoskeletal: no clubbing / cyanosis. No joint deformity upper and lower extremities. Good ROM, no contractures. Normal muscle tone.  Skin: no rashes,  lesions, ulcers. No induration Neurologic: CN 2-12 grossly intact. Sensation intact, DTR normal. Strength 5/5 in all 4.  Psychiatric: Normal judgment and insight. Alert and oriented x 3. Normal mood.     Labs on Admission: I have personally reviewed following labs and imaging studies  CBC:  Recent Labs Lab 05/19/16 2324 05/20/16 0251  WBC 7.9  --   NEUTROABS 4.5  --   HGB 9.1* 8.5*  HCT 29.4* 27.5*  MCV 80.8  --   PLT 308  --    Basic Metabolic Panel:  Recent Labs Lab 05/19/16 2324  NA 138  K 3.7  CL 106  CO2 26  GLUCOSE 94  BUN 19  CREATININE 1.06  CALCIUM 8.8*   GFR: Estimated Creatinine Clearance: 106.9 mL/min (by C-G formula based on Cr of 1.06). Liver Function Tests:  Recent Labs Lab 05/19/16 2324  AST 28  ALT 27  ALKPHOS 50  BILITOT 0.4  PROT 6.1*  ALBUMIN 3.6   No results for input(s): LIPASE, AMYLASE in the last 168 hours. No results for input(s): AMMONIA in the last 168 hours. Coagulation Profile: No results for input(s): INR, PROTIME in the last 168 hours. Cardiac Enzymes: No results for input(s): CKTOTAL, CKMB, CKMBINDEX, TROPONINI in the last 168 hours. BNP (last 3 results) No results for input(s): PROBNP in the last 8760 hours. HbA1C: No results for input(s): HGBA1C in the last 72 hours. CBG: No results for input(s): GLUCAP in the last 168 hours. Lipid Profile: No results for input(s): CHOL, HDL, LDLCALC, TRIG, CHOLHDL, LDLDIRECT in the last 72 hours. Thyroid Function Tests: No results for input(s): TSH, T4TOTAL, FREET4, T3FREE, THYROIDAB in the last 72 hours. Anemia Panel: No results for input(s): VITAMINB12, FOLATE, FERRITIN, TIBC, IRON, RETICCTPCT in the last 72 hours. Urine analysis: No results found for: COLORURINE, APPEARANCEUR, LABSPEC, PHURINE, GLUCOSEU, HGBUR, BILIRUBINUR, KETONESUR, PROTEINUR, UROBILINOGEN, NITRITE, LEUKOCYTESUR Sepsis Labs: No results found for this or any previous visit (from the past 240 hour(s)).    Radiological Exams on Admission: Ct Abdomen Pelvis W Contrast  05/20/2016  CLINICAL DATA:  Hematochezia. Patient with history of Crohn's disease. EXAM: CT ABDOMEN AND PELVIS WITH CONTRAST TECHNIQUE: Multidetector CT imaging of the abdomen and pelvis was performed using the standard protocol following bolus administration of intravenous contrast. CONTRAST:  155m ISOVUE-300 IOPAMIDOL (ISOVUE-300) INJECTION 61% COMPARISON:  None. FINDINGS: Lower chest:  The included lung bases are clear. No pleural fluid. Liver: Focal fatty infiltration adjacent with falciform ligament. No suspicious lesion. Hepatobiliary: Gallbladder physiologically distended, no calcified stone. No biliary dilatation. Pancreas: No ductal dilatation or inflammation. Spleen: Normal.  Splenule inferiorly. Adrenal glands: No nodule. Kidneys: Symmetric renal enhancement. No hydronephrosis. There multiple vascular collaterals in the region of the left renal hilum. The left renal vein is patent. Stomach/Bowel: Stomach physiologically distended. No gastric wall thickening or inflammation. There are no dilated or thickened small bowel loops. No small bowel inflammation. Post presumed ileus a CT STEMI with enteric suture in the right lower quadrant. No associated small  or large bowel wall thickening at the enteric anastomosis. Contrast and stool throughout the colon. Short segment of decompressed colon in the mid sigmoid, difficult to exclude wall thickening at this level. There is otherwise no colonic inflammation. Minimal perirectal soft tissue edema is seen. Vascular/Lymphatic: Prominent central mesenteric lymph nodes likely reactive. No retroperitoneal adenopathy. Abdominal aorta is normal in caliber. Reproductive: Normal sized prostate gland. Question of scrotal varicoceles, left greater than right. Bladder: Physiologically distended, no wall thickening. Other: No free air, free fluid, or intra-abdominal fluid collection. Musculoskeletal: There are  no acute or suspicious osseous abnormalities. Degenerative disc disease at L4-L5. IMPRESSION: 1. Mild perirectal soft tissue stranding, in this setting hematochezia may be acute proctitis. 2. Short-segment decompressed sigmoid colon, difficult to exclude wall thickening at this level. There is otherwise no evidence of bowel inflammation in the abdomen or pelvis or findings to suggest additional sites of active Crohn's disease. 3. Venous collaterals about the left renal hilum, significance is uncertain. There are questionable left-sided scrotal varicoceles, which may contribute to these collaterals. The left renal vein is patent. Electronically Signed   By: Jeb Levering M.D.   On: 05/20/2016 04:00      Assessment/Plan Rectal bleeding/ Hematochezia: Acute. Patient notes 4 episodes of bloody bowel movements in the last 24 hours. Dr. Paulita Fujita gastroenterology was consulted in the ED. - Admit to MedSurg bed - NPO - IVF NS at 125 ml/hr - Gastroenterology to see in a.m.   Acute blood loss anemia: Hemoglobin initially noted to be 9.1 g/dL on admission and recheck 8.5. Baseline hemoglobin appears be around 11 previously back in 04/2015 when looking at records from care everywhere. - Type and screened for possible need of blood products - H&H every 4 hours - Transfuse blood if hemoglobin drops less than 7 or patient becomes symptomatic  History of iron deficiency anemia: Patient has recently been receiving iron transfusions over the last 2 weeks as he reports his iron level dropped to 4.   Crohn's disease: Patient currently on Humira and is followed by Dr. Emelda Fear of gastroenterology at [facility].  DVT prophylaxis: SCDs  Code Status: Full Family Communication: Discussed plan with the patient's wife present at bedside Disposition Plan: Possible discharge home in 1-3 days if able to control bleeding Consults called: Gastroenterology Admission status: MedSurg observation  Norval Morton MD [facility] Pager [phone]  If 7PM-7AM, please contact night-coverage [email or web address] Password [email or web address]  05/20/2016, 4:52 AM

## 2016-05-20 NOTE — Progress Notes (Signed)
Pt returned from sigmoidoscopy. No active bleeding seen. GI spoke with pt and family following procedure. Pt stated since he stable, he would like to be discharged and follow up with his personal GI at [facility]. Pt is aware of orders for follow up H&H this evening, but stated he is fine with waiting until he see's his personal MD and already has appt for  Monday 7/10. MD paged.

## 2016-05-20 NOTE — Progress Notes (Signed)
Pt reports no weakness. Will continue to monitor.

## 2016-05-20 NOTE — Discharge Summary (Signed)
Physician Discharge Summary   Patient ID: Allen Ewing MRN: [id number] DOB/AGE: February 03, 1981 35 y.o.  Admit date: 05/19/2016 Discharge date: 05/20/2016  Primary Care Physician:  Halina Maidens, MD  Discharge Diagnoses:     . Hematochezia . Acute blood loss anemia . Crohn's disease (HCC) . Anemia, iron deficiency  Consults:  GI   Recommendations for Outpatient Follow-up:   1. Please repeat CBC/BMET at next visit  DIET: heart healthy diet    Allergies:   Allergies  Allergen Reactions  . Ceclor [Cefaclor]      DISCHARGE MEDICATIONS: Current Discharge Medication List    CONTINUE these medications which have NOT CHANGED   Details  acetaminophen (TYLENOL) 325 MG tablet Take 650 mg by mouth every 6 (six) hours as needed for mild pain.    Adalimumab (HUMIRA PEN) 40 MG/0.8ML PNKT Inject 0.8 mLs into the skin every 14 (fourteen) days.    diphenhydrAMINE (SOMINEX) 25 MG tablet Take 25 mg by mouth daily as needed for allergies or sleep.    Multiple Vitamin (MULTIVITAMIN) tablet Take 1 tablet by mouth daily.         Brief H and P: For complete details please refer to admission H and P, but in brief Allen Ewing is a 35 y.o. male with medical history significant of Chron's disease and iron deficiency anemia; Presented with complaints of rectal bleeding. Symptoms started a day before the admission when he woke up around 8 AM. He noted having some abdominal pressure and gas/bloating feeling prior to using the restroom. Upon looking in the toilet bowel after having a bowel movement he noted bright red blood. Subsequently in the evening he had another bloody bowel movements with large clots. He reports that he has not had any significant flares due to his Crohn's disease and is currently on Humira. He is followed by Dr. Haze Boyden of gastroenterology at [facility] Back in 2013 he had some scar tissue resected out of his small bowel. He had a follow-up MRI last year that showed no  recurrence of scar tissue formation. Otherwise patient notes that he chronically has iron deficiency anemia for which he has received 2 iron transfusions over the last 2 weeks. Patient has had similar symptoms with bleeding like this happen back in 2012. He was diagnosed with possibly having a diverticular bleed at that time.  Hospital Course:   Hematochezia/Rectal bleeding with underlying history of Crohn's disease - CT abdomen and pelvis showed no acute colitis however has acute proctitis. - Patient underwent Extended sigmoidoscopy to near the splenic flexure showed inherent mahogany stool with appears of old blood becoming blacker and thicker toward the splenic flexure. Exam adequate only to about 60 cm with vigorous lavage. Other than old and apparent mahogany colored stool which was most red tinged toward the rectum no other abnormalities were seen with old good visualization. There is no active bleeding. - Per GI, Dr Petra Kuba, patient is stable for discharge and follow-up with his [facility] gastroenterologist.   Acute blood loss anemia: Hemoglobin initially noted to be 9.1 g/dL on admission and recheck 8.5. Baseline hemoglobin appears be around 11 previously back in 04/2015  - Patient noted that the rectal bleeding is improving. H&H stable - Transfuse blood if hemoglobin drops less than 7 or patient becomes symptomatic  History of iron deficiency anemia: Patient has recently been receiving iron transfusions over the last 2 weeks as he reports his iron level dropped to 4.   Crohn's disease: Patient currently on Humira and is followed by  Dr. Haze Boyden of gastroenterology at [facility].  Day of Discharge BP 121/51 mmHg  Pulse 77  Temp(Src) 97.5 F (36.4 C) (Oral)  Resp 15  Ht 6\' 2"  (1.88 m)  Wt 53.207 kg (117 lb 4.8 oz)  BMI 15.05 kg/m2  SpO2 100%  Physical Exam: General: Alert and awake oriented x3 not in any acute distress. HEENT: anicteric sclera, pupils reactive to light and accommodation CVS:  S1-S2 clear no murmur rubs or gallops Chest: clear to auscultation bilaterally, no wheezing rales or rhonchi Abdomen: soft nontender, nondistended, normal bowel sounds Extremities: no cyanosis, clubbing or edema noted bilaterally Neuro: Cranial nerves II-XII intact, no focal neurological deficits   The results of significant diagnostics from this hospitalization (including imaging, microbiology, ancillary and laboratory) are listed below for reference.    LAB RESULTS: Basic Metabolic Panel:  Recent Labs Lab 05/19/16 2324 05/20/16 0712  NA 138 141  K 3.7 3.7  CL 106 107  CO2 26 28  GLUCOSE 94 92  BUN 19 13  CREATININE 1.06 1.09  CALCIUM 8.8* 8.7*   Liver Function Tests:  Recent Labs Lab 05/19/16 2324  AST 28  ALT 27  ALKPHOS 50  BILITOT 0.4  PROT 6.1*  ALBUMIN 3.6   No results for input(s): LIPASE, AMYLASE in the last 168 hours. No results for input(s): AMMONIA in the last 168 hours. CBC:  Recent Labs Lab 05/19/16 2324 05/20/16 0251 05/20/16 0712  WBC 7.9  --  7.5  NEUTROABS 4.5  --   --   HGB 9.1* 8.5* 8.5*  HCT 29.4* 27.5* 27.7*  MCV 80.8  --  80.1  PLT 308  --  279   Cardiac Enzymes: No results for input(s): CKTOTAL, CKMB, CKMBINDEX, TROPONINI in the last 168 hours. BNP: Invalid input(s): POCBNP CBG: No results for input(s): GLUCAP in the last 168 hours.  Significant Diagnostic Studies:  Ct Abdomen Pelvis W Contrast  05/20/2016  CLINICAL DATA:  Hematochezia. Patient with history of Crohn's disease. EXAM: CT ABDOMEN AND PELVIS WITH CONTRAST TECHNIQUE: Multidetector CT imaging of the abdomen and pelvis was performed using the standard protocol following bolus administration of intravenous contrast. CONTRAST:  ISOVUE-300 IOPAMIDOL (ISOVUE-300) INJECTION 61% COMPARISON:  None. FINDINGS: Lower chest:  The included lung bases are clear. No pleural fluid. Liver: Focal fatty infiltration adjacent with falciform ligament. No suspicious lesion.  Hepatobiliary: Gallbladder physiologically distended, no calcified stone. No biliary dilatation. Pancreas: No ductal dilatation or inflammation. Spleen: Normal.  Splenule inferiorly. Adrenal glands: No nodule. Kidneys: Symmetric renal enhancement. No hydronephrosis. There multiple vascular collaterals in the region of the left renal hilum. The left renal vein is patent. Stomach/Bowel: Stomach physiologically distended. No gastric wall thickening or inflammation. There are no dilated or thickened small bowel loops. No small bowel inflammation. Post presumed ileus a CT STEMI with enteric suture in the right lower quadrant. No associated small or large bowel wall thickening at the enteric anastomosis. Contrast and stool throughout the colon. Short segment of decompressed colon in the mid sigmoid, difficult to exclude wall thickening at this level. There is otherwise no colonic inflammation. Minimal perirectal soft tissue edema is seen. Vascular/Lymphatic: Prominent central mesenteric lymph nodes likely reactive. No retroperitoneal adenopathy. Abdominal aorta is normal in caliber. Reproductive: Normal sized prostate gland. Question of scrotal varicoceles, left greater than right. Bladder: Physiologically distended, no wall thickening. Other: No free air, free fluid, or intra-abdominal fluid collection. Musculoskeletal: There are no acute or suspicious osseous abnormalities. Degenerative disc disease at L4-L5. IMPRESSION: 1.  Mild perirectal soft tissue stranding, in this setting hematochezia may be acute proctitis. 2. Short-segment decompressed sigmoid colon, difficult to exclude wall thickening at this level. There is otherwise no evidence of bowel inflammation in the abdomen or pelvis or findings to suggest additional sites of active Crohn's disease. 3. Venous collaterals about the left renal hilum, significance is uncertain. There are questionable left-sided scrotal varicoceles, which may contribute to these  collaterals. The left renal vein is patent. Electronically Signed   By: Rubye Oaks M.D.   On: 05/20/2016 04:00    2D ECHO:   Disposition and Follow-up:    DISPOSITION: home    DISCHARGE FOLLOW-UP Follow-up Information    Follow up with Halina Maidens, MD. Schedule an appointment as soon as possible for a visit in 2 weeks.   Specialty:  Gastroenterology   Why:  for hospital follow-up   Contact information:   [phone]       Follow up with Riccardo Dubin, MD. Schedule an appointment as soon as possible for a visit in 10 days.   Specialty:  Internal Medicine   Why:  for hospital follow-up   Contact information:   [address] [place] [address] [phone]        Time spent on Discharge:   Signed:   Lisbeth Puller M.D. [facility] 05/20/2016, 4:08 PM Pager: [phone]

## 2016-05-20 NOTE — Op Note (Signed)
[facility] Patient Name: Allen Ewing Procedure Date : 05/20/2016 MRN: [id number] Attending MD: Barrie Folk , MD Date of Birth: [birth year] CSN: [id number] Age: 35 Admit Type: Inpatient Procedure:                Flexible Sigmoidoscopy Indications:              Hematochezia Providers:                Everardo All. Madilyn Fireman, MD, Dwain Sarna, RN, Kandice Robinsons, Technician Referring MD:              Medicines:                Fentanyl 100 micrograms IV, Midazolam 8 mg IV,                            Diphenhydramine 25 mg IV Complications:            No immediate complications. Estimated Blood Loss:     Estimated blood loss: none. Procedure:                Pre-Anesthesia Assessment:                           - Prior to the procedure, a History and Physical                            was performed, and patient medications and                            allergies were reviewed. The patient's tolerance of                            previous anesthesia was also reviewed. The risks                            and benefits of the procedure and the sedation                            options and risks were discussed with the patient.                            All questions were answered, and informed consent                            was obtained. Prior Anticoagulants: The patient has                            taken no previous anticoagulant or antiplatelet                            agents. ASA Grade Assessment: II - A patient with  mild systemic disease. After reviewing the risks                            and benefits, the patient was deemed in                            satisfactory condition to undergo the procedure.                           After obtaining informed consent, the scope was                            passed under direct vision. The EC-[id number] ([id number])                            scope was introduced through  the anus and advanced                            to the the descending colon. After obtaining                            informed consent, the scope was passed under direct                            vision. The flexible sigmoidoscopy was accomplished                            without difficulty. The patient tolerated the                            procedure well. The quality of the bowel                            preparation was adequate to identify polyps 6 mm                            and larger in size and fair. Scope In: Scope Out: Findings:      A moderate amount of semi-solid stool was found in the rectum, in the       sigmoid colon and in the descending colon, making visualization       difficult. Lavage of the area was performed using a large amount of tap       water, resulting in clearance with adequate visualization.      The exam was otherwise without abnormality.      There is no endoscopic evidence of bleeding, diverticula, erythema,       inflammation, mass, polyps or ulcerations in the rectum, in the       recto-sigmoid colon, in the sigmoid colon and in the descending colon. Impression:               - Preparation of the colon was fair.                           - Stool in the rectum, in the sigmoid colon and  in                            the descending colon.                           - The examination was otherwise normal.                           - No specimens collected. Recommendation:           - Continue present medications.                           - Discharge patient to home (ambulatory).                           - Return to GI office at appointment to be                            scheduled. Procedure Code(s):        --- Professional ---                           [phone], Sigmoidoscopy, flexible; diagnostic,                            including collection of specimen(s) by brushing or                            washing, when performed (separate  procedure) Diagnosis Code(s):        --- Professional ---                           K92.1, Melena (includes Hematochezia) CPT copyright 2016 [employer]. All rights reserved. The codes documented in this report are preliminary and upon coder review may  be revised to meet current compliance requirements. Barrie Folk, MD 05/20/2016 3:10:20 PM This report has been signed electronically. Number of Addenda: 0

## 2016-05-20 NOTE — Progress Notes (Signed)
GI addendum. Extended sigmoidoscopy to near the splenic flexure showed inherent mahogany stool with appears of old blood becoming blacker and thicker toward the splenic flexure. Exam adequate only to about 60 cm with vigorous lavage. Other than old and apparent mahogany colored stool which was most red tinged toward the rectum no other abnormalities were seen with old good visualization. There is no active bleeding. I think he is stable for discharge today. He  would like to follow-up with his [facility] gastroenterologist.

## 2016-05-20 NOTE — Consult Note (Signed)
Referring Provider:  Dr. Rod Can Primary Care Physician:  Halina Maidens, MD Primary Gastroenterologist:  Dr. Riccardo Dubin [facility].)  Reason for Consultation:  Rectal bleeding  HPI: Allen Ewing is a 35 y.o. male admitted through the emergency room last night because of rectal bleeding.   Outside records are not available but he seems to be a reliable historian. He states he has a history of Crohn's disease diagnosed at age 40, involving the small bowel, for which he underwent terminal ileal resection in [place] in 2000. Through the years, he was treated with various medicines including Remicade and, more recently, Humira under the care of Dr. Riccardo Dubin at [facility]. By his report, his Crohn's is in remission at this time, apparently including an MRI scan last year.  In 2013, he had a stricture and "scar tissue" resulting in what sounds like lysis of adhesions and a limited recurrent TI resection at [facility].  From the Crohn's perspective, he feels well. He does not have abdominal pain, any functional limitations, anorexia, weight loss, or dietary limitations. He is basically asymptomatic except that his stools do tend to run on the loose side.  He has been treated for iron deficiency anemia diagnosed within the past year and treated with intravenous iron infusions in [place], [place], where he resides, as ordered by Dr. Haze Boyden.  He does not feel as though he is having a Crohn's flareup at this time.  With all that background, around 8:00 yesterday morning, he felt like he needed to have a bowel movement and had a lot of gas and pressure and had a "blowout" of bright red blood. He felt fine and actually went on to mow the lawn yesterday, without any lightheadedness or syncope, and without any abdominal pain or cramps. However, later in the day he had another passage of blood, primarily blood without much stool, and this happened yet a third time before presenting to the  emergency room last night. Since then, he has had passage of blood on 2 additional occasions, with a progressively smaller amounts such that on the most recent time, a couple of hours ago, he was just a few flecks of blood.  The patient indicates that he had an episode similar to this several years ago, which was not evaluated and which spontaneously resolved.  The patient had an abdominal CT scan on presentation to the emergency room last night, which raised the question of some possible localized sigmoid thickening, but no frank colitis, nor any evidence of diverticular disease.   Past Medical History  Diagnosis Date  . Crohn disease [facility])     Past Surgical History  Procedure Laterality Date  . Small intestine surgery    . Tonsillectomy      Prior to Admission medications   Medication Sig Start Date End Date Taking? Authorizing Provider  acetaminophen (TYLENOL) 325 MG tablet Take 650 mg by mouth every 6 (six) hours as needed for mild pain.   Yes Historical Provider, MD  Adalimumab (HUMIRA PEN) 40 MG/0.8ML PNKT Inject 0.8 mLs into the skin every 14 (fourteen) days. 05/06/16  Yes Historical Provider, MD  diphenhydrAMINE (SOMINEX) 25 MG tablet Take 25 mg by mouth daily as needed for allergies or sleep.   Yes Historical Provider, MD  Multiple Vitamin (MULTIVITAMIN) tablet Take 1 tablet by mouth daily.   Yes Historical Provider, MD    Current Facility-Administered Medications  Medication Dose Route Frequency Provider Last Rate Last Dose  . 0.9 %  sodium chloride  infusion   Intravenous Continuous Clydie Braun, MD 125 mL/hr at 05/20/16 [id number]    . albuterol (PROVENTIL) (2.5 MG/3ML) 0.083% nebulizer solution 2.5 mg  2.5 mg Nebulization Q2H PRN Rondell A Katrinka Blazing, MD      . diatrizoate meglumine-sodium (GASTROGRAFIN) 66-10 % solution           . ondansetron (ZOFRAN) injection 4 mg  4 mg Intravenous Q8H PRN Shawn C Joy, PA-C      . pantoprazole (PROTONIX) EC tablet 40 mg  40 mg Oral Daily Shawn  C Joy, PA-C   40 mg at 05/20/16 1003    Allergies as of 05/19/2016 - Review Complete 05/19/2016  Allergen Reaction Noted  . Ceclor [cefaclor]  08/14/2011    History reviewed. No pertinent family history.  Social History   Social History  . Marital Status: Married    Spouse Name: N/A  . Number of Children: N/A  . Years of Education: N/A   Occupational History  . Not on file.   Social History Main Topics  . Smoking status: Never Smoker   . Smokeless tobacco: Not on file  . Alcohol Use: Yes     Comment: occasional  . Drug Use: No  . Sexual Activity: Not on file   Other Topics Concern  . Not on file   Social History Narrative    Review of Systems: Negative for ill health, such as chest pain, dyspnea, extraintestinal manifestations of IBD such as mouth sores, joint effusions, ocular pain, or skin rashes; negative for urinary symptoms such as hematuria or pneumaturia.  Physical Exam: Vital signs in last 24 hours: Temp:  [97.8 F (36.6 C)-98.3 F (36.8 C)] 98.3 F (36.8 C) (07/07 0555) Pulse Rate:  [78-92] 88 (07/07 0555) Resp:  [18] 18 (07/07 0555) BP: (104-129)/(58-93) 104/58 mmHg (07/07 0555) SpO2:  [99 %-100 %] 100 % (07/07 0555) Weight:  [53.207 kg (117 lb 4.8 oz)-79.153 kg (174 lb 8 oz)] 53.207 kg (117 lb 4.8 oz) (07/07 0555) Last BM Date: 05/20/16 General:   Alert,  Well-developed, well-nourished, pleasant and cooperative in NAD Head:  Normocephalic and atraumatic. Eyes:  Sclera clear, no icterus.   Conjunctiva slightly pale. Mouth:   No ulcerations or lesions.  Oropharynx pale but moist. Neck:   No masses or thyromegaly. Lungs:  Clear throughout to auscultation.   No wheezes, crackles, or rhonchi. No evident respiratory distress. Heart:   Regular rate and rhythm; no murmurs, clicks, rubs,  or gallops. Abdomen:  Soft, nontender, nontympanitic, and nondistended. No masses, hepatosplenomegaly or ventral hernias noted. Normal bowel sounds, without bruits,  guarding, or rebound.   Rectal:  Was heme positive in ER   Msk:   Symmetrical without gross deformities. Pulses:  Normal radial pulse is noted. Extremities:   Without clubbing, cyanosis, or edema. Neurologic:  Alert and coherent;  grossly normal neurologically. Skin:  Somewhat pale, but otherwise intact without significant lesions or rashes. Cervical Nodes:  No significant cervical adenopathy. Psych:   Alert and cooperative. Normal mood and affect.  Intake/Output from previous day: 07/06 0701 - 07/07 0700 In: 33.3 [I.V.:33.3] Out: -  Intake/Output this shift:    Lab Results:  Recent Labs  05/19/16 2324 05/20/16 0251 05/20/16 0712  WBC 7.9  --  7.5  HGB 9.1* 8.5* 8.5*  HCT 29.4* 27.5* 27.7*  PLT 308  --  279   BMET  Recent Labs  05/19/16 2324 05/20/16 0712  NA 138 141  K 3.7 3.7  CL 106 107  CO2 26 28  GLUCOSE 94 92  BUN 19 13  CREATININE 1.06 1.09  CALCIUM 8.8* 8.7*   LFT  Recent Labs  05/19/16 2324  PROT 6.1*  ALBUMIN 3.6  AST 28  ALT 27  ALKPHOS 50  BILITOT 0.4   PT/INR No results for input(s): LABPROT, INR in the last 72 hours.  Studies/Results: Ct Abdomen Pelvis W Contrast  05/20/2016  CLINICAL DATA:  Hematochezia. Patient with history of Crohn's disease. EXAM: CT ABDOMEN AND PELVIS WITH CONTRAST TECHNIQUE: Multidetector CT imaging of the abdomen and pelvis was performed using the standard protocol following bolus administration of intravenous contrast. CONTRAST:  ISOVUE-300 IOPAMIDOL (ISOVUE-300) INJECTION 61% COMPARISON:  None. FINDINGS: Lower chest:  The included lung bases are clear. No pleural fluid. Liver: Focal fatty infiltration adjacent with falciform ligament. No suspicious lesion. Hepatobiliary: Gallbladder physiologically distended, no calcified stone. No biliary dilatation. Pancreas: No ductal dilatation or inflammation. Spleen: Normal.  Splenule inferiorly. Adrenal glands: No nodule. Kidneys: Symmetric renal enhancement. No  hydronephrosis. There multiple vascular collaterals in the region of the left renal hilum. The left renal vein is patent. Stomach/Bowel: Stomach physiologically distended. No gastric wall thickening or inflammation. There are no dilated or thickened small bowel loops. No small bowel inflammation. Post presumed ileus a CT STEMI with enteric suture in the right lower quadrant. No associated small or large bowel wall thickening at the enteric anastomosis. Contrast and stool throughout the colon. Short segment of decompressed colon in the mid sigmoid, difficult to exclude wall thickening at this level. There is otherwise no colonic inflammation. Minimal perirectal soft tissue edema is seen. Vascular/Lymphatic: Prominent central mesenteric lymph nodes likely reactive. No retroperitoneal adenopathy. Abdominal aorta is normal in caliber. Reproductive: Normal sized prostate gland. Question of scrotal varicoceles, left greater than right. Bladder: Physiologically distended, no wall thickening. Other: No free air, free fluid, or intra-abdominal fluid collection. Musculoskeletal: There are no acute or suspicious osseous abnormalities. Degenerative disc disease at L4-L5. IMPRESSION: 1. Mild perirectal soft tissue stranding, in this setting hematochezia may be acute proctitis. 2. Short-segment decompressed sigmoid colon, difficult to exclude wall thickening at this level. There is otherwise no evidence of bowel inflammation in the abdomen or pelvis or findings to suggest additional sites of active Crohn's disease. 3. Venous collaterals about the left renal hilum, significance is uncertain. There are questionable left-sided scrotal varicoceles, which may contribute to these collaterals. The left renal vein is patent. Electronically Signed   By: Rubye Oaks M.D.   On: 05/20/2016 04:00    Impression: 1. Lower GI bleeding without hemodynamic instability. Differential diagnosis would include ischemic colitis, Crohn's  colitis, self-limited diverticular hemorrhage, vascular ectasia, internal hemorrhoids. None of these diagnoses, however, seems to fit the clinical picture perfectly.  2. Iron deficiency anemia, under treatment with iron infusions. Baseline hemoglobin not known, so it is not clear to what degree the patient's current hemoglobin of 8.5 is reflective of acute posthemorrhagic anemia.  2. Long-standing Crohn's ileitis by patient's history, on Humira through [employer].  Plan: We will proceed to a limited colonoscopy exam this afternoon. Purpose and risks including perforation, bleeding, and anesthesia risk reviewed with patient. We will probably do it unprepped so as to more clearly ascertain the amount and location of blood within the colonic lumen. If visualization is an adequate because of lack of prep, consideration could be given to prepping the patient and doing a more complete exam tomorrow.     Nathanial Arrighi V  05/20/2016, 11:44 AM  Pager [phone] If no answer or after 5 PM call [phone]

## 2016-05-20 NOTE — Progress Notes (Signed)
[facility]                                                                              Patient Demographics  Allen Ewing, is a 35 y.o. male, DOB - 08-Feb-1981, BZJ:[id number]  Admit date - 05/19/2016   Admitting Physician Clydie Braun, MD  Outpatient Primary MD for the patient is Halina Maidens, MD  Outpatient specialists:   LOS -   days    Chief Complaint  Patient presents with  . Hematochezia       Brief summary   Allen Ewing is a 35 y.o. male with medical history significant of Chron's disease and iron deficiency anemia; Presented with complaints of rectal bleeding. Symptoms started a day before the admission when he woke up around 8 AM. He noted having some abdominal pressure and gas/bloating feeling prior to using the restroom. Upon looking in the toilet bowel after having a bowel movement he noted bright red blood. Subsequently in the evening he had another bloody bowel movements with large clots. He reports that he has not had any significant flares due to his Crohn's disease and is currently on Humira. He is followed by Dr. Haze Boyden of gastroenterology at [facility]. Back in 2013 he had some scar tissue resected out of his small bowel. He had a follow-up MRI last year that showed no recurrence of scar tissue formation. Otherwise patient notes that he chronically has iron deficiency anemia for which he has received 2 iron transfusions over the last 2 weeks. Patient has had similar symptoms with bleeding like this happen back in 2012. He was diagnosed with possibly having a diverticular bleed at that time.   Assessment & Plan    Principal Problem:   Hematochezia/Rectal bleeding with underlying history of Crohn's disease - CT abdomen and pelvis showed no acute colitis however has acute proctitis. - d/w Dr Matthias Hughs who recommended flex sig today and then decide the further course of management  - Continue clears for now  Active Problems: Acute  blood loss anemia: Hemoglobin initially noted to be 9.1 g/dL on admission and recheck 8.5. Baseline hemoglobin appears be around 11 previously back in 04/2015  - Patient noted that the rectal bleeding is improving, recheck H&H in the evening today - Transfuse blood if hemoglobin drops less than 7 or patient becomes symptomatic  History of iron deficiency anemia: Patient has recently been receiving iron transfusions over the last 2 weeks as he reports his iron level dropped to 4.   Crohn's disease: Patient currently on Humira and is followed by Dr. Haze Boyden of gastroenterology at [facility].  Code Status: Full code DVT Prophylaxis:  SCD's Family Communication: Discussed in detail with the patient, all imaging results, lab results explained to the patient   Disposition Plan:   Time Spent in minutes  25 minutes  Procedures:  CT abdomen  Consultants:   GI  Antimicrobials:      Medications  Scheduled Meds: . pantoprazole  40 mg Oral Daily   Continuous Infusions: . sodium chloride 125 mL/hr at 05/20/16 0643   PRN Meds:.albuterol, ondansetron (ZOFRAN) IV   Antibiotics   Anti-infectives  None        Subjective:   Allen Ewing was seen and examined today.  Per patient, one episode of BM this morning, with improving hematochezia. Patient denies dizziness, chest pain, shortness of breath, abdominal pain, new weakness, numbess, tingling. No acute events overnight.  No nausea or vomiting.  Objective:   Filed Vitals:   05/20/16 0418 05/20/16 0500 05/20/16 0555 05/20/16 1315  BP: 116/71 105/66 104/58 100/54  Pulse: 84 80 88 89  Temp:   98.3 F (36.8 C) 98.3 F (36.8 C)  TempSrc:  Oral Oral   Resp: 18  18 16   Height:   6\' 2"  (1.88 m)   Weight:   53.207 kg (117 lb 4.8 oz)   SpO2: 100% 99% 100% 100%    Intake/Output Summary (Last 24 hours) at 05/20/16 1319 Last data filed at 05/20/16 1156  Gross per 24 hour  Intake  33.33 ml  Output    350 ml  Net -316.67 ml      Wt Readings from Last 3 Encounters:  05/20/16 53.207 kg (117 lb 4.8 oz)  08/13/11 82.555 kg (182 lb)     Exam  General: Alert and oriented x 3, NAD  HEENT:    Neck: Supple, no JVD,  Cardiovascular: S1 S2 auscultated, no rubs, murmurs or gallops. Regular rate and rhythm.  Respiratory: Clear to auscultation bilaterally, no wheezing, rales or rhonchi  Gastrointestinal: Soft, nontender, nondistended, + bowel sounds  Ext: no cyanosis clubbing or edema  Neuro: AAOx3, Cr N's II- XII. Strength 5/5 upper and lower extremities bilaterally  Skin: No rashes  Psych: Normal affect and demeanor, alert and oriented x3    Data Reviewed:  I have personally reviewed following labs and imaging studies  Micro Results No results found for this or any previous visit (from the past 240 hour(s)).  Radiology Reports Ct Abdomen Pelvis W Contrast  05/20/2016  CLINICAL DATA:  Hematochezia. Patient with history of Crohn's disease. EXAM: CT ABDOMEN AND PELVIS WITH CONTRAST TECHNIQUE: Multidetector CT imaging of the abdomen and pelvis was performed using the standard protocol following bolus administration of intravenous contrast. CONTRAST:  ISOVUE-300 IOPAMIDOL (ISOVUE-300) INJECTION 61% COMPARISON:  None. FINDINGS: Lower chest:  The included lung bases are clear. No pleural fluid. Liver: Focal fatty infiltration adjacent with falciform ligament. No suspicious lesion. Hepatobiliary: Gallbladder physiologically distended, no calcified stone. No biliary dilatation. Pancreas: No ductal dilatation or inflammation. Spleen: Normal.  Splenule inferiorly. Adrenal glands: No nodule. Kidneys: Symmetric renal enhancement. No hydronephrosis. There multiple vascular collaterals in the region of the left renal hilum. The left renal vein is patent. Stomach/Bowel: Stomach physiologically distended. No gastric wall thickening or inflammation. There are no dilated or thickened small bowel loops. No small bowel  inflammation. Post presumed ileus a CT STEMI with enteric suture in the right lower quadrant. No associated small or large bowel wall thickening at the enteric anastomosis. Contrast and stool throughout the colon. Short segment of decompressed colon in the mid sigmoid, difficult to exclude wall thickening at this level. There is otherwise no colonic inflammation. Minimal perirectal soft tissue edema is seen. Vascular/Lymphatic: Prominent central mesenteric lymph nodes likely reactive. No retroperitoneal adenopathy. Abdominal aorta is normal in caliber. Reproductive: Normal sized prostate gland. Question of scrotal varicoceles, left greater than right. Bladder: Physiologically distended, no wall thickening. Other: No free air, free fluid, or intra-abdominal fluid collection. Musculoskeletal: There are no acute or suspicious osseous abnormalities. Degenerative disc disease at L4-L5. IMPRESSION: 1. Mild perirectal soft  tissue stranding, in this setting hematochezia may be acute proctitis. 2. Short-segment decompressed sigmoid colon, difficult to exclude wall thickening at this level. There is otherwise no evidence of bowel inflammation in the abdomen or pelvis or findings to suggest additional sites of active Crohn's disease. 3. Venous collaterals about the left renal hilum, significance is uncertain. There are questionable left-sided scrotal varicoceles, which may contribute to these collaterals. The left renal vein is patent. Electronically Signed   By: Rubye Oaks M.D.   On: 05/20/2016 04:00    Lab Data:  CBC:  Recent Labs Lab 05/19/16 2324 05/20/16 0251 05/20/16 0712  WBC 7.9  --  7.5  NEUTROABS 4.5  --   --   HGB 9.1* 8.5* 8.5*  HCT 29.4* 27.5* 27.7*  MCV 80.8  --  80.1  PLT 308  --  279   Basic Metabolic Panel:  Recent Labs Lab 05/19/16 2324 05/20/16 0712  NA 138 141  K 3.7 3.7  CL 106 107  CO2 26 28  GLUCOSE 94 92  BUN 19 13  CREATININE 1.06 1.09  CALCIUM 8.8* 8.7*    GFR: Estimated Creatinine Clearance: 69.8 mL/min (by C-G formula based on Cr of 1.09). Liver Function Tests:  Recent Labs Lab 05/19/16 2324  AST 28  ALT 27  ALKPHOS 50  BILITOT 0.4  PROT 6.1*  ALBUMIN 3.6   No results for input(s): LIPASE, AMYLASE in the last 168 hours. No results for input(s): AMMONIA in the last 168 hours. Coagulation Profile: No results for input(s): INR, PROTIME in the last 168 hours. Cardiac Enzymes: No results for input(s): CKTOTAL, CKMB, CKMBINDEX, TROPONINI in the last 168 hours. BNP (last 3 results) No results for input(s): PROBNP in the last 8760 hours. HbA1C: No results for input(s): HGBA1C in the last 72 hours. CBG: No results for input(s): GLUCAP in the last 168 hours. Lipid Profile: No results for input(s): CHOL, HDL, LDLCALC, TRIG, CHOLHDL, LDLDIRECT in the last 72 hours. Thyroid Function Tests: No results for input(s): TSH, T4TOTAL, FREET4, T3FREE, THYROIDAB in the last 72 hours. Anemia Panel: No results for input(s): VITAMINB12, FOLATE, FERRITIN, TIBC, IRON, RETICCTPCT in the last 72 hours. Urine analysis: No results found for: COLORURINE, APPEARANCEUR, LABSPEC, PHURINE, GLUCOSEU, HGBUR, BILIRUBINUR, KETONESUR, PROTEINUR, UROBILINOGEN, NITRITE, Hurshel Party M.D. [facility] 05/20/2016, 1:19 PM  Pager: [phone] Between 7am to 7pm - call Pager - [phone]  After 7pm go to [email or web address] - password [email or web address]  Call night coverage person covering after 7pm

## 2019-10-01 ENCOUNTER — Ambulatory Visit
Admission: RE | Admit: 2019-10-01 | Discharge: 2019-10-01 | Disposition: A | Discharge status: Home / Self Care | Payer: BLUE CROSS/BLUE SHIELD | Source: Ambulatory Visit | Attending: Sports Medicine | Admitting: Sports Medicine

## 2019-10-01 ENCOUNTER — Ambulatory Visit (INDEPENDENT_AMBULATORY_CARE_PROVIDER_SITE_OTHER): Payer: BLUE CROSS/BLUE SHIELD | Admitting: Sports Medicine

## 2019-10-01 ENCOUNTER — Other Ambulatory Visit: Payer: Self-pay

## 2019-10-01 VITALS — BP 112/72 | Ht 74.0 in | Wt 175.0 lb

## 2019-10-01 DIAGNOSIS — M25562 Pain in left knee: Secondary | ICD-10-CM

## 2019-10-02 ENCOUNTER — Encounter: Payer: Self-pay | Admitting: Sports Medicine

## 2019-10-02 NOTE — Progress Notes (Signed)
   Subjective:    Patient ID: Allen Ewing, male    DOB: 07-Feb-1981, 38 y.o.   MRN: [id number]  HPI chief complaint: Left knee pain  Very pleasant 38 year old male comes in today complaining of 3 months of left knee pain.  Pain began acutely after he jumped from a stepladder.  He had no pain immediately but the next day began to develop pain that he describes as being "deep inside my knee".  His pain is intermittent and associated with mild swelling.  He does describe feelings of instability from time to time.  He has tried icing and bracing but neither have been very helpful.  No pain at rest.  No locking or catching.  Pain does not radiate down his leg.  No prior knee surgeries.  Past medical history reviewed Medications reviewed Allergies reviewed   Review of Systems    As above Objective:   Physical Exam  Well-developed, well-nourished.  No acute distress.  Awake alert and oriented x3.  Vital signs reviewed.  Left knee: Full range of motion.  No effusion.  No soft tissue swelling.  Extensor mechanism is intact.  There is no tenderness to palpation along medial or lateral joint lines.  Negative McMurray's.  Negative Thessaly's.  Negative anterior drawer, negative posterior drawer.  Knee is stable to valgus and varus stressing.  No tenderness to palpation along the patella or quadriceps tendon.  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  Neurovascularly intact distally.  Walking without a limp.  X-rays of the left knee including AP, lateral, and sunrise views show nothing acute.  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      Assessment & Plan:   Chronic left knee pain worrisome for osteochondral injury or possible meniscal tear Patella [place]  Patient has had symptoms for 4 months after a trauma to the left knee.  X-rays are fairly unremarkable.  I recommended an MRI but the patient would like to hold  on that for now.  Instead, we will try a double upright brace with activity and he will start daily isometric quad exercises.  Follow-up with me in 6 weeks for reevaluation.  If symptoms or not improving then we will need to reconsider MRI at that time.  Patient is encouraged to call with questions or concerns in the interim.

## 2019-11-19 ENCOUNTER — Ambulatory Visit: Payer: BLUE CROSS/BLUE SHIELD | Admitting: Sports Medicine

## 2020-03-31 IMAGING — DX DG KNEE COMPLETE 4+V*L*
5 series · 5 of 5 positions shown · non-contrast
Comparison: None.

CLINICAL DATA: Left knee pain for 4 months after fall from ladder.
Initial encounter.

EXAM:
LEFT KNEE - COMPLETE 4+ VIEW

[dg knee complete 4 views left (1 of 5)]
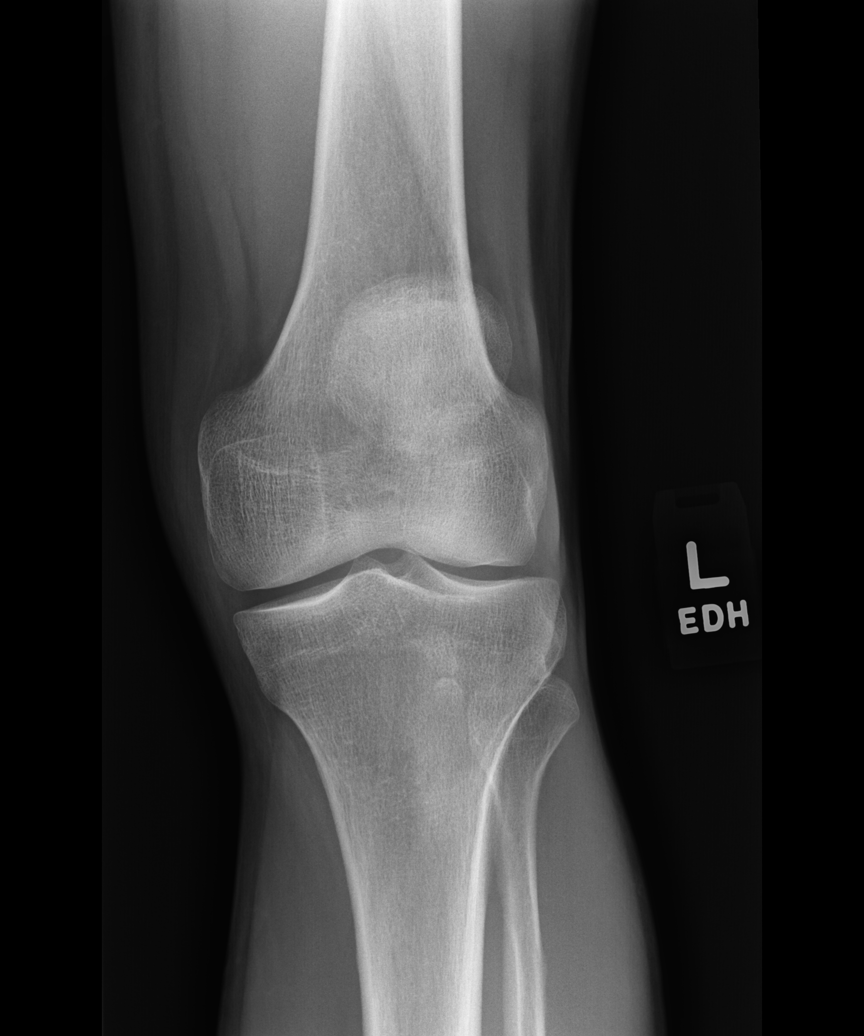

[dg knee complete 4 views left (2 of 5)]
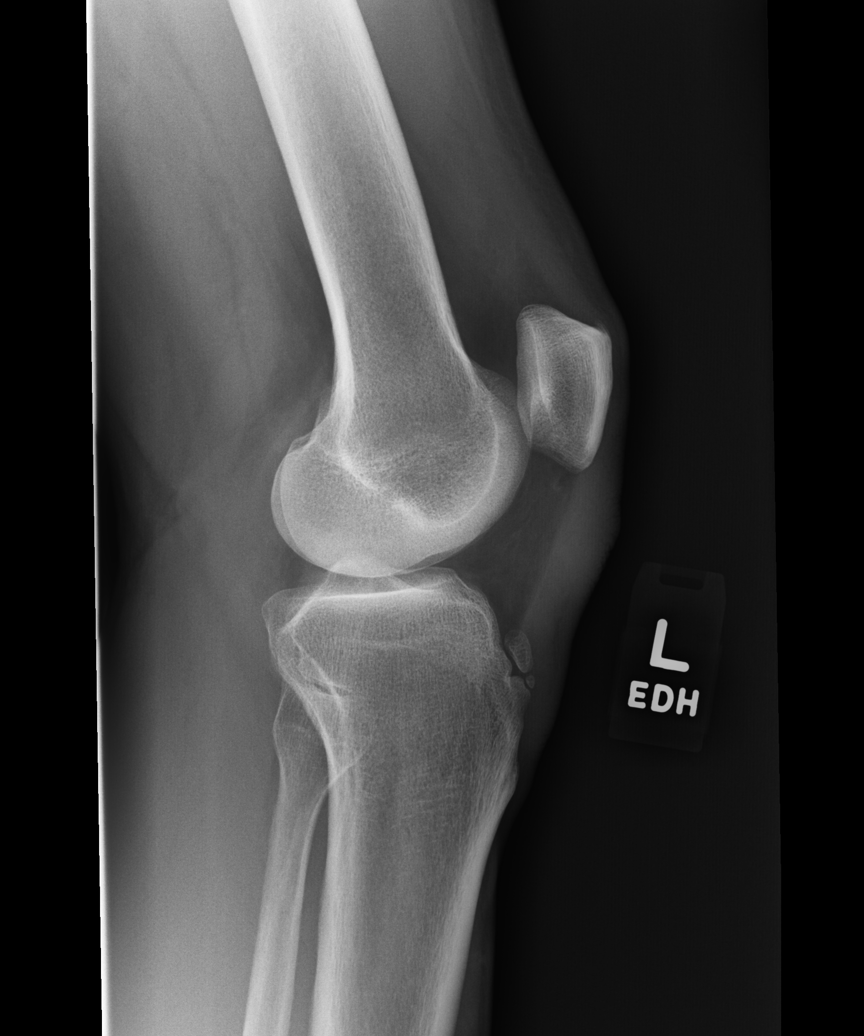

[dg knee complete 4 views left (3 of 5)]
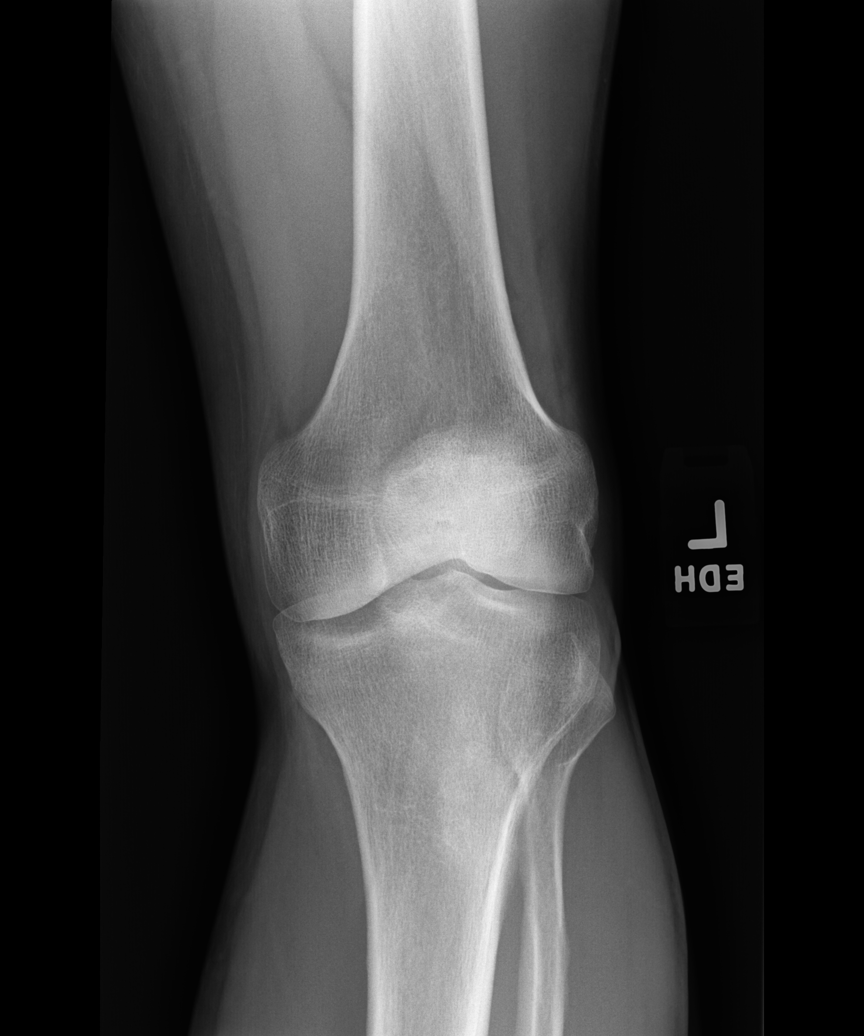

[dg knee complete 4 views left (4 of 5)]
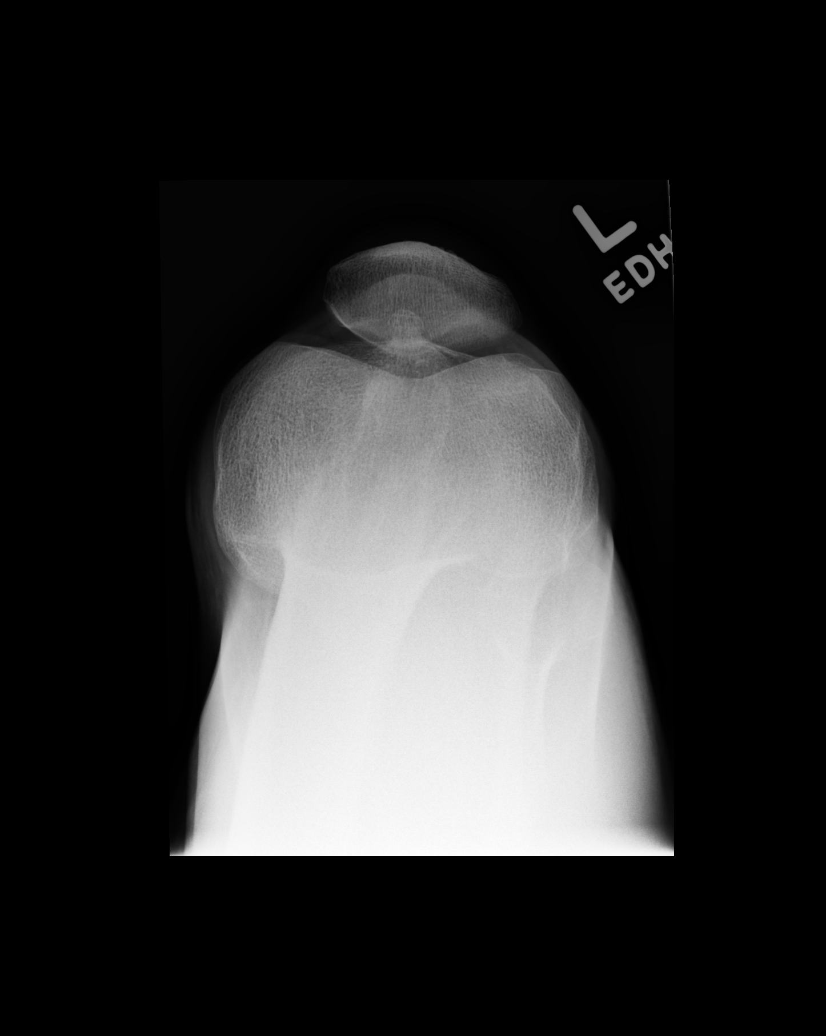

[dg knee complete 4 views left (5 of 5)]
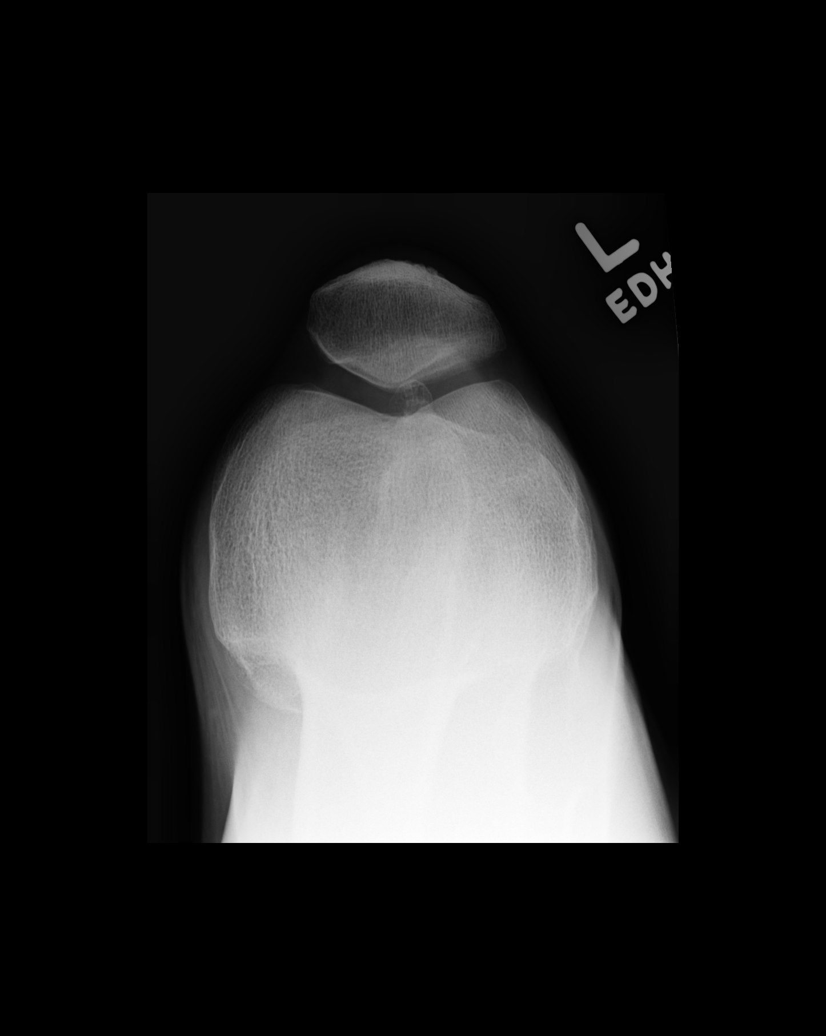

[5 of 5 positions shown; findings below may reference images not displayed]

FINDINGS: No evidence of fracture or dislocation. No evidence of knee joint
effusion. No evidence of arthropathy or other bone lesions. Patella
alta is noted, and injury of the infrapatellar tendon cannot be
excluded.
IMPRESSION: No evidence of fracture or knee joint effusion.

Patella alta, which can be seen with infrapatellar tendon injury;
recommend correlation with physical exam findings.
